# Patient Record
Sex: Female | Born: 1985 | Race: White | Hispanic: No | Marital: Married
Health system: Midwestern US, Community
[De-identification: ages and names within clinical notes are randomized; demographics above are authoritative.]

---

## 2010-12-21 NOTE — Progress Notes (Signed)
Chief complaint:     Chief Complaint   Patient presents with   ??? Endometriosis     found 2007 during a lap, pt would like to discuss options   ??? Pelvic Pain     pelvic pain x 8 months, pain described as shooting in vaginal area       Subjective:   KYLAN VEACH is a 25 y.o. female here for her annual exam. Past medical history, medications, surgeries, allergies and family history were reviewed and updated. Patient's last menstrual period was 12/10/2010.Marland Kitchen Periods are not regular- 25-36 days apart, without  Cramping. Cramping starts intermittently throughout the month and gets worse when period starts. 5-6/10 when not in period-  In period 8-9/10.  Intercourse is painful most of the time. Severe cramping after 7/10. Motrin 400 mg every 6  Hours takes edge off. Has gotten worse last 8 months. Pain is in vaginal area and in urethra area. Flow lasting 7.days. She changes a pad/ tampon every 2 hours. She is not on birth control. NFP.             ROS:  She  complains of abdominal pain, pressure or bloating. Bloating associated with bowels  She complains of constipation, diarrhea, blood in stool, difficulty emptying, encopresis or hemorrhoids.Diarrhea  She denies urinary frequency, urgency, burning, difficulty emptying or incontinence.  She denies vaginal itching, odor, discharge, dryness or dyspareunia. Itching occasionally  She complains of breast lumps, tenderness, recent nipple inversion or galactorrhea. Left breast lump- PCP ordered Mam and USN normal- has recently lost 18 #   She denies of PMS issues.  She denies of menopausal symptoms.  She denies of sexuality concerns.        PHYSICAL EXAM:    BP 117/76   Pulse 74   Ht 5' 6.8" (1.697 m)   Wt 118 lb 3.2 oz (53.615 kg)   BMI 18.62 kg/m2   LMP 12/10/2010   Breastfeeding? No        General Appearance:  DAYA DUTT appears to be non-distressed, well developed, well nourished and well groomed.      HEENT: unremarkable.    Skin:  There was a normal inspection of the  skin without rashes or lesions.    Lymphatic:  No lymph nodes were palpable in the neck , axilla or groin.    Neck:  The thyroid was not enlarged and had no masses. Full range of motion noted.    Respiratory:  The lungs were clear to auscultation bilaterally.     Cardiovascular:  The heart was in a regular rate and rhythm. There was no murmur, rubs or extra sounds appreciated.    Back:  Straight. No CVA tenderness appreciated.    Abdomen:  The abdomen was soft and non-tender. There were bowel sounds present in all quadrants. There was no distention, guarding, rebound, ascites or rigidity.     Psych:  The patient had a normal orientation to: time, place, person, and situation  There is no mood / affect changes    Breast:  (Chest)  The patients breasts were symmetrical. There was a 2 cm nodule in left breast at 1200. Had diagnostic mam and USN in April which was negative. No discharge, skin changes or puckering bilateral. Both nipples were everted. Will get results from PCP    Pelvic Exam:  The external genitalia was without lesions or masses, with a normal appearance.          The vaginal vault  was normal in appearance, pink with rugae. There were no cystocele,  rectocele, or enterocele appreciated. There was no vaginal discharge.  The cervix was without lesions, freely mobile. There was no cervical motion tenderness.  The uterus is normal sized. The adnexa were palpated and noted no fullness, tenderness or masses in either region. There is tenderness and burning in vagina with insertion of speculum  Groin:  No evidence of hernia.    Rectal Exam:  Hemorrhoids present    ASSESSMENT:    Encounter Diagnoses   Name Primary?   ??? Pelvic pain in female    ??? Endometriosis    breast mass-left      PLAN::  Orders Placed This Encounter   Procedures   ??? Ultrasound pelvis complete       Pap was not obtained- done 1-2 months ago. Self breast exam and vulvar exam was discussed and encouraged monthly. Bone health diet and exercise  was discussed.  Encouraged to take in 3 servings of calcium per day and recommend a Multivitamin daily.   1. Return for annual in 3/13   2. Consult with Dr Shari Heritage- sooner than 6/25 if possible.  3. Names of breast surgeons

## 2010-12-31 NOTE — Progress Notes (Signed)
Chief Complaint   Patient presents with   ??? Breast Mass     left, is being sent to [facility] for this, but hasnt heard from her office.  MAMMogram done at breast center of [facility].  calling to get the results.  We are also still waiting to receive records from [facility]   ??? Pelvic Pain     today hurts more on left side, pt does have endometriosis (originally dx'd by [facility] by l/s), and started her period today.  pelvic usn under images     HPI: 25 G2P2 (c/s x2), here to evaluate breast mass and discuss worsening pain from endometriosis. Pain is 6-7/10, intermittent, periods worsen. Dysmenorrhea. Not interested in Lupron. Done with childbearing and the trauma around childbearing, and desires definitive treatment of endometriosis. Knows that I suggest tlh, bso, and not leaving ovaries as a treament of endometriosis. We have multiple options of treatment to work with menopausal symptoms.   Relevant Past Medical History:   Patient Active Problem List   Diagnoses   ??? Pelvic pain in female   ??? Endometriosis   ??? Breast mass, left   ??? Weight loss     Medications:  Current Outpatient Prescriptions   Medication Sig Dispense Refill   ??? ibuprofen (ADVIL;MOTRIN) 200 MG tablet Take 200 mg by mouth as needed.         ??? levothyroxine (SYNTHROID) 50 MCG tablet Take 50 mcg by mouth daily.           Allergies:  Allergies   Allergen Reactions   ??? Latex Rash   ??? Amoxicillin Hives   ??? Augmentin Hives   ??? Codeine      hyperactive   ??? Effexor (Venlafaxine Hydrochloride)      Fast heart beat     Past Medical History   Diagnosis Date   ??? Abdominal pain    ??? Pelvic pain in female    ??? Dysmenorrhea    ??? Viral meningitis      as an infant   ??? IBS (irritable bowel syndrome)    ??? GERD (gastroesophageal reflux disease)    ??? Migraine    ??? Ovarian cyst rupture 08/1998   ??? Diarrhea    ??? ASCUS (atypical squamous cells of undetermined significance) on Pap smear 3/12     HRHPV not detected   ??? Endometriosis 2007     Dx ls w/ Dr.  Andrey Cota     Past Surgical History   Procedure Date   ??? Laparoscopy 2007   ??? Cesarean section 2004, 2009     History   Smoking status   ??? Current Everyday Smoker   Smokeless tobacco   ??? Not on file     History   Alcohol Use   ??? Yes     social     Psychosocial History: None    REVIEW OF SYSTEMS:   General appearance:Appears healthy.  Alert; in no acute distress.  Pleasant.  CARDIOVASCULAR: Denies: chest pain, dyspnea on exertion, palpitations  RESPIRATORY: Denies: cough, shortness of breath, wheezing  GI: Denies: abdominal pain, flank pain, nausea, vomiting, diarrhea  GU: Denies: dysuria, frequency/urgency, hematuria, pelvic pain    vaginal bleeding  MUSCULOSKELETAL: Denies: back pain, joint swelling, muscle pain  SKIN: Denies: rash, hives.    PHYSICAL EXAM:  Blood pressure 97/52, pulse 84, height 5\' 7"  (1.702 m), weight 122 lb 12.8 oz (55.702 kg), last menstrual period 12/31/2010, not currently breastfeeding.  General Appearance:  awake,  alert, oriented, in no acute distress, well developed, well nourished, in no acute distress   Skin:  Skin color, texture, turgor normal. No rashes or lesions  Mouth/Throat:  Mucosa moist.  No lesions.  Pharynx without erythema, edema or exudate.  Lungs:  Normal expansion.  Clear to auscultation.  No rales, rhonchi, or wheezing.  Heart:  Heart sounds are normal.  Regular rate and rhythm without murmur, gallop or rub.  Breast:  Inspection negative. No nipple discharge or bleeding. No palpable mass  Abdomen:  Soft, non-tender, normal bowel sounds.  No bruits, organomegaly or masses.  Extremities: Extremities warm to touch, pink, with no edema.  Musculoskeletal:  negative  Peripheral Pulses:  Normal  Neurologic:  Gait normal. Reflexes normal and symmetric. Sensation grossly intact.  Female Urogen:  External genitalia, appear normal and without lesions; bimanual=-8-9/10 pain with palpation (no bladder pain with palp).  Bimanual exam was limited by pain. Could not fully assess uterus but I  don't believe it is enlarged.  Female Rectal: No hemorrhoids, normal rectal tone, no masses. 8-9/10.    Procedure: AOU Dec 24 2010 8:46AM [id number] PELVIC ECHO COMPLETE   Reason for Exam: ENDOMETRIOSIS / PELVIC PAIN   FULL RESULT: Procedure: Pelvic Ultrasound   Comparison: March 26, 2004.   Technique: Transvaginal and transabdominal.   Signs And Symptoms: Endometriosis, pain.   Findings: The uterus measures 7.1 x 3.6 x 6.0 cm. The endometrial   stripe thickness is 0.7 cm.   The right ovary measures 4.1 x 2.6 x 3.5 cm.   The left ovary measures 3.5 x 1.8 x 2.7 cm.   The uterus is normal. No mass or cyst. No endometrial fluid. Small   amount of fluid in the cul-de-sac.   The ovaries are normal. There is developing follicles. There is no mass   or cyst. There is no adnexal pathology.   IMPRESSION: SMALL AMOUNT OF FREE FLUID IN THE CUL-DE-SAC. OTHERWISE   NORMAL EXAM.     Assessment:  1. Endometriosis    2. Pelvic pain in female    3. Breast mass, left       Plan:  Schedule Robotic tlh, bso, cysto.  F/U on records from [facility] re: dx ls.  Forward records to [employer] re mamm/br usn.  Pamphlet re: robotics.

## 2011-01-04 NOTE — Progress Notes (Signed)
Reports copied and faxed to Dr. Darnelle Bosandilee Butler's office along with progress note re: referral for Breast lump

## 2011-02-04 LAB — CBC WITH DIFFERENTIAL
Absolute Eos #: 0.2 10*3/uL (ref 0.0–0.4)
Absolute Lymph #: 2 10*3/uL (ref 1.0–4.8)
Absolute Mono #: 0.5 10*3/uL (ref 0.1–1.2)
Absolute Neut #: 3.1 10*3/uL (ref 1.8–7.7)
Basophils Absolute: 0 10*3/uL (ref 0.0–0.2)
Basophils: 0 % (ref 0–2)
Eosinophils %: 4 % (ref 1–4)
Hematocrit: 42.2 % (ref 36–46)
Hemoglobin: 14.6 g/dL (ref 12.0–16.0)
Lymphocytes: 34 % (ref 24–44)
MCH: 32.7 pg (ref 26–34)
MCHC: 34.5 g/dL (ref 31–37)
MCV: 94.7 fL (ref 80–100)
MPV: 8.7 fL (ref 6.0–12.0)
Monocytes: 8 % (ref 2–11)
Platelets: 137 10*3/uL — ABNORMAL LOW (ref 140–450)
RBC: 4.45 m/uL (ref 4.0–5.2)
RDW: 13.1 % (ref 12.5–15.4)
Seg Neutrophils: 54 % (ref 36–66)
WBC: 5.8 10*3/uL (ref 3.5–11.0)

## 2011-02-04 LAB — TSH: TSH: 3.28 mIU/L (ref 0.35–5.50)

## 2011-02-04 LAB — HCG, SERUM, QUALITATIVE: hCG Qual: NEGATIVE

## 2011-02-04 LAB — BASIC METABOLIC PANEL
Anion Gap: 4 mmol/L — ABNORMAL LOW (ref 8–16)
BUN: 13 mg/dL (ref 6–20)
CO2: 29 mmol/L (ref 20–31)
Calcium: 9.3 mg/dL (ref 8.6–10.4)
Chloride: 110 mmol/L (ref 98–110)
Creatinine: 0.84 mg/dL (ref 0.4–1.0)
GFR African American: >60 mL/min (ref >60)
GFR Non-African American: >60 mL/min (ref >60)
Glucose: 98 mg/dL (ref 74–106)
Potassium: 3.8 mmol/L (ref 3.5–5.1)
Sodium: 139 mmol/L (ref 136–145)

## 2011-02-04 LAB — T3: T3, Total: 79 ng/dL (ref 60–181)

## 2011-02-04 LAB — T4: T4, Total: 6.6 ug/dL (ref 4.5–10.9)

## 2011-03-05 LAB — CBC
Hematocrit: 36.9 % (ref 36–46)
Hemoglobin: 12.9 g/dL (ref 12.0–16.0)
MCH: 33.1 pg (ref 26–34)
MCHC: 35 g/dL (ref 31–37)
MCV: 94.6 fL (ref 80–100)
MPV: 8.9 fL (ref 6.0–12.0)
Platelets: 140 10*3/uL (ref 140–450)
RBC: 3.9 m/uL — ABNORMAL LOW (ref 4.0–5.2)
RDW: 13.9 % (ref 12.5–15.4)
WBC: 5.2 10*3/uL (ref 3.5–11.0)

## 2011-03-05 LAB — HCG, SERUM, QUALITATIVE: hCG Qual: NEGATIVE

## 2011-03-05 LAB — TYPE AND SCREEN
ABO/Rh: B POS
Antibody Screen: NEGATIVE

## 2011-03-05 LAB — BLOOD BANK SPECIMEN

## 2011-03-05 LAB — PREGNANCY, URINE: HCG(Urine) Pregnancy Test: NEGATIVE

## 2011-03-16 NOTE — Progress Notes (Signed)
SHORT FORM HISTORY AND PHYSICAL  [facility]  Date:____________ Time:____________  Chief Complaint   Patient presents with   . Pre-op Exam     Robotic TLH, possible BSO (based on appearance of ovaries at time of surgery), cysto due to pelvic pain and endometriosis on Friday 03/19/11 @ 7:30 am at [facility].       HPI:25 G2P2 (both c/s dt fetal distress) here desires definitive treatment.   Relevant Past Medical History:   Patient Active Problem List   Diagnoses   . Pelvic pain in female   . Endometriosis   . Breast mass, left   . Weight loss     Medications:  Current Outpatient Prescriptions   Medication Sig Dispense Refill   . ibuprofen (ADVIL;MOTRIN) 200 MG tablet Take 200 mg by mouth as needed.         Marland Kitchen levothyroxine (SYNTHROID) 50 MCG tablet Take 50 mcg by mouth daily.           Allergies:  Allergies   Allergen Reactions   . Latex Rash   . Amoxicillin Hives   . Augmentin Hives   . Codeine      Hyperactive; Able to tolerate Percocet (preferred) or Vicodin.    . Effexor (Venlafaxine Hydrochloride)      Fast heart beat   . Pcn (Penicillins) Hives     Past Medical History   Diagnosis Date   . Abdominal pain    . Pelvic pain in female    . Dysmenorrhea    . Viral meningitis      as an infant   . IBS (irritable bowel syndrome)    . GERD (gastroesophageal reflux disease)    . Migraine    . Ovarian cyst rupture 08/1998   . Diarrhea    . ASCUS (atypical squamous cells of undetermined significance) on Pap smear 3/12     HRHPV not detected   . Endometriosis 2007     Dx ls w/ Dr. Andrey Cota   . MTHFR mutation      Heterozygous.       Past Surgical History   Procedure Date   . Laparoscopy 2007   . Cesarean section 2004, 2009     History   Smoking status   . Current Everyday Smoker   Smokeless tobacco   . Not on file     History   Alcohol Use   . Yes     social     Family History   Problem Relation Age of Onset   . Other Paternal Grandfather      colon cancer, heart attack   . Other Maternal Grandmother       breast ca   . Other Father      hyperlipademia   . Other Mother      scleroderma, fibromyalgia   . Other Paternal Grandfather      multiple PE     Psychosocial History: None    REVIEW OF SYSTEMS:   General appearance:Appears healthy.  Alert; in no acute distress.  Pleasant.  CARDIOVASCULAR: Denies: chest pain, dyspnea on exertion, palpitations  RESPIRATORY: Denies: cough, shortness of breath, wheezing  GI: Denies: abdominal pain, flank pain, nausea, vomiting, diarrhea  GU: Denies: dysuria, frequency/urgency, hematuria, pelvic pain    vaginal bleeding  MUSCULOSKELETAL: Denies: back pain, joint swelling, muscle pain  SKIN: Denies: rash, hives.    PHYSICAL EXAM:  Blood pressure 111/72, pulse 65, temperature 97.3 F (36.3 C), height  5\' 7"  (1.702 m), weight 120 lb 12.8 oz (54.795 kg), last menstrual period 03/09/2011, not currently breastfeeding.  General Appearance:  awake, alert, oriented, in no acute distress, well developed, well nourished, in no acute distress   Skin:  Skin color, texture, turgor normal. No rashes or lesions  Mouth/Throat:  Mucosa moist.  No lesions.  Pharynx without erythema, edema or exudate.  Lungs:  Normal expansion.  Clear to auscultation.  No rales, rhonchi, or wheezing.  Heart:  Heart sounds are normal.  Regular rate and rhythm without murmur, gallop or rub.  Breast:  Inspection negative. No nipple discharge or bleeding. No palpable mass  Abdomen:  Soft, non-tender, normal bowel sounds.  No bruits, organomegaly or masses.  Extremities: Extremities warm to touch, pink, with no edema.  Musculoskeletal:  negative  Peripheral Pulses:  Normal  Neurologic:  Gait normal. Reflexes normal and symmetric. Sensation grossly intact.  Female Urogen:  External genitalia, vagina and cervix appear normal and without lesions.  Bimanual exam: no adnexal masses, uterine enlargement ;POSITIVE tenderness noted-cervix 3/10, left 4-5/10.   Female Rectal: deferred dt difficulty tolerating  Assessment:  1. Pelvic  pain    2. Endometriosis    3. Tobacco abuse      Plan:  Robotic TLH, possible BSO (based on appearance of ovaries at time of surgery), cysto due to pelvic pain and endometriosis on Friday 03/19/11 @ 7:30 am at [facility].    Pt concerned about HRT and wants to avoid ovary removal since family history of breast cancer.

## 2011-03-16 NOTE — Patient Instructions (Signed)
Ok to substitute Fleets PhophoSoda (or generic equiv), drink 4 0z w/ Ice 12 hours apart, two times instead of enema.

## 2011-03-17 NOTE — Telephone Encounter (Signed)
Patient phoned office and spoke with Vanessa DykeKelly S.  States wanted to let you know she wants to "keep her ovaries".  Patient was informed Dr. Shari HeritagePohlod would be notified and patient may need to speak with Dr. Shari HeritagePohlod am of OR.

## 2011-03-19 LAB — HCG, Pregnancy Urine (POC): NEGATIVE

## 2011-03-22 LAB — SURGICAL PATHOLOGY

## 2011-03-22 MED ORDER — NITROFURANTOIN MONOHYD MACRO 100 MG PO CAPS
100 MG | ORAL_CAPSULE | Freq: Two times a day (BID) | ORAL | Status: AC
Start: 2011-03-22 — End: 2011-03-29

## 2011-03-22 NOTE — Progress Notes (Signed)
Chief Complaint   Patient presents with   . Post-op Problem     PO # 3.  Robotic TLH, BSO, cysto, ablation of endometriosis and adhesiolysis, bilateral utereal flow. Was kept overnight on Friday due to not enough urine output without a cath.  Was finally sent home on Saturday night around midnight.  Was taken care of in burn unit after surgery.  Was catheterized multiple times without success.  Pt states the cath was placed into her vagina several times which has now caused a lot of pain and swelling at her urethral opening.     . Abdominal Cramping     from surgery and from gas up into shoulders.  Pain is 6/10 in lower abd, taking one Percocet every 6 hours along with Ibuprofen 600 every 6. Passing gas started last night.   . Swelling     around urethra and vaginal opening.      . Nausea     off/on for the past 24 hours. No vomiting. No fevers.    . Wound Check     some light discharge from a few incision   ncisions-  No drainage, swelling, or redness.  Does have some bleeding from the port site.  Nml ADLs. none  Passing gas, bm, void w/o issues. Still has cath in, no BM yet, passing gas ok (started yesterday).   Vag D/C- small amount of blood  HPI:  Pt is here with mother, and they are both very anxious and distraught. They are reporting their perception about the fact that they didn't get consistent care or communication while the patient was in the hospital POD #1.. Pt herself today is very erratic and inconsitent with her desires. One minute asking for a med and then not wanting the med in another minute. I have spoken with three residents whose impression of events contradict what the patient and mother are saying. Pt is overwhelmed that she is having some surgery complications as she is getting ready to move out of [place] (husband has already moved). Pt has verbalized in past that her mother is not capable of helping out, her father is out of the picture and her mother-in-law is not able to help. Pt refuses  counselling, antidepressants or anxiolytics. Pt is willing to do workup. She is also afraid of having catheter removed but understands that we have to do a voiding trial, as well as imaging studies of renal and pelvic areas to r/o hematoma, ureteral injury, etc. I have offerred to the patient and her mother to speak with the patient representative. Mother was very upset that I did not return (an inordinate amt) of phone calls for the patient to reiterate the same desire for ovarian preservation. I raised the issue that the anxiety and panic attacks I have observed lead me to have the patient consider therapy or meds, but pt does not want to evaluate the anxiety at this time.  Relevant Past Medical History:   Patient Active Problem List   Diagnoses   . Pelvic pain in female   . Endometriosis   . Breast mass, left   . Weight loss     Medications:  Current Outpatient Prescriptions   Medication Sig Dispense Refill   . oxyCODONE-acetaminophen (PERCOCET) 5-325 MG per tablet Take 1 tablet by mouth every 6 hours as needed.         Marland Kitchen ibuprofen (ADVIL;MOTRIN) 200 MG tablet Take 200 mg by mouth as needed.         Marland Kitchen  levothyroxine (SYNTHROID) 50 MCG tablet Take 50 mcg by mouth daily.           Allergies:  Allergies   Allergen Reactions   . Latex Rash   . Amoxicillin Hives   . Augmentin Hives   . Codeine      Hyperactive; Able to tolerate Percocet (preferred) or Vicodin.    . Effexor (Venlafaxine Hydrochloride)      Fast heart beat   . Pcn (Penicillins) Hives     Past Medical History   Diagnosis Date   . Abdominal pain    . Pelvic pain in female    . Dysmenorrhea    . Viral meningitis      as an infant   . IBS (irritable bowel syndrome)    . GERD (gastroesophageal reflux disease)    . Migraine    . Ovarian cyst rupture 08/1998   . Diarrhea    . ASCUS (atypical squamous cells of undetermined significance) on Pap smear 3/12     HRHPV not detected   . Endometriosis 2007     Dx ls w/ Dr. Andrey Cota   . MTHFR mutation      Heterozygous.    . Panic attack    . Anxiety      Has not been seeing counselor due to cost; has not tolerated numerous antidep and anxiolytics- none seemed to work, fears addiction for Xanax     Past Surgical History   Procedure Date   . Laparoscopy 2007   . Cesarean section 2004, 2009   . Hysterectomy 03/19/11     Robotic TLH,, cysto, ablation of endometriosis and adhesiolysis, bilateral utereal flow.      History   Smoking status   . Current Everyday Smoker   Smokeless tobacco   . Not on file     History   Alcohol Use   . Yes     social     Family History   Problem Relation Age of Onset   . Other Paternal Grandfather      colon cancer, heart attack   . Other Maternal Grandmother      breast ca   . Other Father      hyperlipademia   . Other Mother      scleroderma, fibromyalgia   . Other Paternal Grandfather      multiple PE     Psychosocial History: None    REVIEW OF SYSTEMS:   General appearance:Appears healthy.  Alert; in no acute distress.  Pleasant.  CARDIOVASCULAR: Denies: chest pain, dyspnea on exertion, palpitations  RESPIRATORY: Denies: cough, shortness of breath, wheezing  GI: Denies: abdominal pain, flank pain, nausea, vomiting, diarrhea  GU: Denies: dysuria, frequency/urgency, hematuria, pelvic pain    vaginal bleeding  MUSCULOSKELETAL: Denies: back pain, joint swelling, muscle pain  SKIN: Denies: rash, hives.    PHYSICAL EXAM:  Blood pressure 118/76, pulse 76, temperature 98.1 F (36.7 C), height 5\' 7"  (1.702 m), weight 120 lb (54.432 kg), last menstrual period 03/09/2011, not currently breastfeeding.  General Appearance:  awake, alert, oriented, in no acute distress, well developed, well nourished, in no acute distress   Skin:  Skin color, texture, turgor normal. No rashes or lesions  Mouth/Throat:  Mucosa moist.  No lesions.  Pharynx without erythema, edema or exudate.  Lungs:  Normal expansion.  Clear to auscultation.  No rales, rhonchi, or wheezing.  Heart:  Heart sounds are normal.  Regular rate and rhythm  without murmur, gallop or rub.  Abdomen:  Soft, non-tender, normal bowel sounds.  No bruits, organomegaly or masses.  Extremities: Extremities warm to touch, pink, with no edema.  Musculoskeletal:  negative  Peripheral Pulses:  Normal  Neurologic:  Gait normal. Reflexes normal and symmetric. Sensation grossly intact.  Pelvic: Pt is very anxious, could barely spread legs apart. I see no evidence of edema or swelling at the labia. Catheter was removed. Pain was 6/10 at vaginal cuff and possibility of cuff hematoma was considered.  Rectal: No evidence of constipation. No pain. Fullness at cuff was appreciated.  Assessment:  1. Urinary retention    2. Pelvic cramping    3. Possible cuff fluid collection.  4. Suspect anxiety driven urinary retention    PLAN:  Remove cath  Script to reinsert cath if retention. Go to ER if volume is very small despite frequent voiding or if you notice bladder distention.  Reports that pain has decreased to a 4 after removal of foley catheter, but still guarded.  Prophylactic Macrobid for 7 days. Pt worried about taking antibiotic, and stated she probably wouldn't take the antibiotic even if I prescribed it.  Ordered pelvic and renal ultrasound-attempt to get both done today if possible.  RTO on Thursdsay.  TIME SPENT IN ROOM: ONE HOUR

## 2011-03-22 NOTE — Progress Notes (Signed)
Vernice JeffersonHelen Lambros is Patient representative at [facility]. [phone]

## 2011-03-23 NOTE — Telephone Encounter (Signed)
I spoke with Vanessa Rios and told her the results of the pelvic and renal usn she had done yesterday (03-22-11[facility]. She was relieved to heard that the results where normal. Pt is feeling much better today, pain is better, just feels a little pressure, and is urinating at about 200 ml every 2 hours or so. Pt states she is constipated and wanted to know if it would be ok to take milk of magnesia. I checked with Vanessa Rios and she stated that would be fine to use. Pt stated she will see Vanessa Rios at her appt on Thursday, and she was told if she had any questions or concerns between now and then to please give the office a call.

## 2011-03-25 MED ORDER — OXYCODONE-ACETAMINOPHEN 5-325 MG PO TABS
5-325 MG | ORAL_TABLET | ORAL | Status: DC | PRN
Start: 2011-03-25 — End: 2011-04-06

## 2011-03-25 NOTE — Progress Notes (Signed)
Chief Complaint   Patient presents with   . Urinary Retention     Pt states she has been urinating more than 250 cc every 3 hours but still feels like her bladder is full after voiding.     . Vaginal Pain     Pt has pain and also feels like her vagina is swollen.    . Dysuria     Pain before,during,and after urinating in urethra.   . Insomnia     and also hard to sit down due to pain.   . Abdominal Pain     LLQ pain 8/10, described as stabbing.  Also has had a clicking/popping feeling just below xiphoid process and left rib.      HPI: Pt has not been taking Percocet as directed. Still having dysuria. Insomnia is due to pain. Reveiwed that pelvic and renal ultrasounds were normal. Pain was sl better yesterday, but thinks that constipation and swelling/cramping feelings are making her feel worse again today..  Relevant Past Medical History:   Patient Active Problem List   Diagnoses   . Pelvic pain in female   . Endometriosis   . Breast mass, left   . Weight loss     Medications:  Current Outpatient Prescriptions   Medication Sig Dispense Refill   . oxyCODONE-acetaminophen (PERCOCET) 5-325 MG per tablet Take 2 tablets by mouth every 4 hours as needed for Pain for 20 doses.  40 tablet  0   . oxyCODONE-acetaminophen (PERCOCET) 5-325 MG per tablet Take 1 tablet by mouth every 6 hours as needed.         . nitrofurantoin, macrocrystal-monohydrate, (MACROBID) 100 MG capsule Take 1 capsule by mouth 2 times daily for 7 days.  14 capsule  0   . ibuprofen (ADVIL;MOTRIN) 200 MG tablet Take 200 mg by mouth as needed.         Marland Kitchen levothyroxine (SYNTHROID) 50 MCG tablet Take 50 mcg by mouth daily.           Allergies:  Allergies   Allergen Reactions   . Latex Rash   . Amoxicillin Hives   . Augmentin Hives   . Codeine      Hyperactive; Able to tolerate Percocet (preferred) or Vicodin.    . Effexor (Venlafaxine Hydrochloride)      Fast heart beat   . Pcn (Penicillins) Hives     Past Medical History   Diagnosis Date   . Abdominal pain     . Pelvic pain in female    . Dysmenorrhea    . Viral meningitis      as an infant   . IBS (irritable bowel syndrome)    . GERD (gastroesophageal reflux disease)    . Migraine    . Ovarian cyst rupture 08/1998   . Diarrhea    . ASCUS (atypical squamous cells of undetermined significance) on Pap smear 3/12     HRHPV not detected   . Endometriosis 2007     Dx ls w/ Dr. Andrey Cota   . MTHFR mutation      Heterozygous.   . Panic attack    . Anxiety      Has not been seeing counselor due to cost; has not tolerated numerous antidep and anxiolytics- none seemed to work, fears addiction for Xanax     Past Surgical History   Procedure Date   . Laparoscopy 2007   . Cesarean section 2004, 2009   . Hysterectomy 03/19/11     Robotic  TLH, cysto, ablation of endometriosis and adhesiolysis, bilateral ureteral flow.      History   Smoking status   . Current Everyday Smoker   Smokeless tobacco   . Not on file     History   Alcohol Use   . Yes     social     Family History   Problem Relation Age of Onset   . Other Paternal Grandfather      colon cancer, heart attack   . Other Maternal Grandmother      breast ca   . Other Father      hyperlipademia   . Other Mother      scleroderma, fibromyalgia   . Other Paternal Grandfather      multiple PE     Psychosocial History: None    REVIEW OF SYSTEMS:   General appearance:Appears healthy.  Alert; in no acute distress.  Pleasant.  CARDIOVASCULAR: Denies: chest pain, dyspnea on exertion, palpitations  RESPIRATORY: Denies: cough, shortness of breath, wheezing  GI: Denies: abdominal pain, flank pain, nausea, vomiting, diarrhea  GU: Denies: dysuria, frequency/urgency, hematuria, pelvic pain    vaginal bleeding  MUSCULOSKELETAL: Denies: back pain, joint swelling, muscle pain  SKIN: Denies: rash, hives.    PHYSICAL EXAM:  Blood pressure 121/79, pulse 81, temperature 97 F (36.1 C), weight 127 lb 9.6 oz (57.879 kg), last menstrual period 03/09/2011, not currently breastfeeding.  General Appearance:   awake, alert, oriented, in no acute distress, well developed, well nourished, in no acute distress   Skin:  Skin color, texture, turgor normal. No rashes or lesions  Mouth/Throat:  Mucosa moist.  No lesions.  Pharynx without erythema, edema or exudate.  Lungs:  Normal expansion.  Clear to auscultation.  No rales, rhonchi, or wheezing.  Heart:  Heart sounds are normal.  Regular rate and rhythm without murmur, gallop or rub.  Abdomen:  Soft, non-tender, normal bowel sounds.  No bruits, organomegaly or masses.  Extremities: Extremities warm to touch, pink, with no edema.  Musculoskeletal:  negative  Peripheral Pulses:  Normal  Neurologic:  Gait normal. Reflexes normal and symmetric. Sensation grossly intact.  Female Urogen:  External genitalia,appear normal and without lesions-no evidence of edema, erythema, rash or appearance of allergic response.  Bimanual exam: deferred.  Assessment:  1. Dysuria    2. Pelvic pain    3. Insomnia       Plan:  Script for Percocet given.  Advised to take Percocet as directed for pain.  MOM for constipation -does not want stool softener. Says that OTC enemas don't seem to work.  Try using icepack on perineum.  Continue antibiotics.  Keep first postop visit for next week.  >50% spent in care counselling and coordinating of the 25 min.

## 2011-03-30 NOTE — Progress Notes (Signed)
POD # 11    From.  Robotic TLH, cysto, ablation of endometriosis and adhesiolysis, bilateral ureteral flow on 03/19/11.   Doing well.  better  Incisions- no s/s of infection at any of incision sites.   Nml ADLs.  No bending, no lifting, other activities normal now. Low energy still.    Passing gas, bm, void w/o issues. Dysuria noted, regular BM and passing gas ok.   Vag D/C- none  Path reviewed-all benign.  BP 117/72  Pulse 79  Temp 97 F (36.1 C)  Ht 5\' 7"  (1.702 m)  Wt 121 lb 6.4 oz (55.067 kg)  BMI 19.01 kg/m2  LMP 03/09/2011  Breastfeeding? No  PE-unremarkable.   Well healed sutures. Three knots removed from port sites, bandages applied.  Abd soft, nt, nd, +bs  No drng from vagina. No evidence of granulation tissue  Follow-up: Annual in March 2013 or sooner if issues

## 2011-04-06 NOTE — Progress Notes (Signed)
Chief Complaint   Patient presents with   . Wound Check     PO # 19. Robotic TLH, cysto, ablation of endometriosis and adhesiolysis, bilateral ureteral flow.  incision check today.  some of her stitches are sticking out at incision at bellybutton.   . Vaginal Pain     about 3 am she woke up with a pain in vaginal walls.     Vaginal spasm was not related to urination-spontaneously awakened her and used Motrin seemed to help.  Urination a lot better.  Curious about intercourse.  Pain in pelvic area from endometriosis seems to be better, as well as diarrhea.  No FCNVD.  Doing well.  yes  Incisions- needs sutures trimmed at bellybutton  Nml ADLs.  yes  Passing gas, bm, void w/o issues.  none  Vag D/C- none  Past Medical History   Diagnosis Date   . Abdominal pain    . Pelvic pain in female    . Dysmenorrhea    . Viral meningitis      as an infant   . IBS (irritable bowel syndrome)    . GERD (gastroesophageal reflux disease)    . Migraine    . Ovarian cyst rupture 08/1998   . Diarrhea    . ASCUS (atypical squamous cells of undetermined significance) on Pap smear 3/12     HRHPV not detected   . Endometriosis 2007     Dx ls w/ Dr. Andrey Cota   . MTHFR mutation      Heterozygous.   . Panic attack    . Anxiety      Has not been seeing counselor due to cost; has not tolerated numerous antidep and anxiolytics- none seemed to work, fears addiction for Xanax     Past Surgical History   Procedure Date   . Laparoscopy 2007   . Cesarean section 2004, 2009   . Hysterectomy 03/19/11     Robotic TLH, cysto, ablation of endometriosis and adhesiolysis, bilateral ureteral flow.      Current Outpatient Prescriptions on File Prior to Visit   Medication Sig Dispense Refill   . ibuprofen (ADVIL;MOTRIN) 200 MG tablet Take 200 mg by mouth as needed.         Marland Kitchen levothyroxine (SYNTHROID) 50 MCG tablet Take 50 mcg by mouth daily.           Allergies   Allergen Reactions   . Latex Rash   . Amoxicillin Hives   . Augmentin Hives   . Codeine       Hyperactive; Able to tolerate Percocet (preferred) or Vicodin.    . Effexor (Venlafaxine Hydrochloride)      Fast heart beat   . Pcn (Penicillins) Hives     BP 113/77  Pulse 74  Ht 5\' 7"  (1.702 m)  Wt 124 lb (56.246 kg)  BMI 19.42 kg/m2  LMP 03/09/2011  Breastfeeding? No  PE-unremarkable.   Well healed sutures-knot removed from umbilicus area  No drng from vagina.  Pelvic deferred  1. Vaginal pain    2. Visit for suture removal      F/U annual   OK to be stimulated but no penetrating intercourse until 4-6 wks.

## 2016-01-10 ENCOUNTER — Emergency Department: Admit: 2016-01-10 | Payer: BLUE CROSS/BLUE SHIELD

## 2016-01-10 ENCOUNTER — Inpatient Hospital Stay
Admit: 2016-01-10 | Discharge: 2016-01-10 | Disposition: A | Discharge status: Home / Self Care | Payer: BLUE CROSS/BLUE SHIELD | Attending: Emergency Medicine

## 2016-01-10 DIAGNOSIS — R1013 Epigastric pain: Secondary | ICD-10-CM

## 2016-01-10 LAB — CBC WITH AUTO DIFFERENTIAL
Absolute Eos #: 0.1 10*3/uL (ref 0.0–0.4)
Absolute Lymph #: 2.1 10*3/uL (ref 1.0–4.8)
Absolute Mono #: 0.4 10*3/uL (ref 0.1–1.2)
Basophils Absolute: 0 10*3/uL (ref 0.0–0.2)
Basophils: 0 %
Eosinophils %: 2 %
Hematocrit: 42.8 % (ref 36–46)
Hemoglobin: 14.5 g/dL (ref 12.0–16.0)
Lymphocytes: 38 %
MCH: 31.3 pg (ref 26–34)
MCHC: 33.9 g/dL (ref 31–37)
MCV: 92.4 fL (ref 80–100)
MPV: 9.2 fL (ref 6.0–12.0)
Monocytes: 7 %
Platelets: 163 10*3/uL (ref 140–450)
RBC: 4.63 m/uL (ref 4.0–5.2)
RDW: 12.3 % — ABNORMAL LOW (ref 12.5–15.4)
Seg Neutrophils: 53 %
Segs Absolute: 2.9 10*3/uL (ref 1.8–7.7)
WBC: 5.5 10*3/uL (ref 3.5–11.0)

## 2016-01-10 LAB — BRAIN NATRIURETIC PEPTIDE: Pro-BNP: 42 pg/mL (ref <300)

## 2016-01-10 LAB — COMPREHENSIVE METABOLIC PANEL
ALT: 19 U/L (ref 5–33)
AST: 21 U/L (ref <32)
Albumin/Globulin Ratio: 1.5 (ref 1.0–2.5)
Albumin: 4.1 g/dL (ref 3.5–5.2)
Alkaline Phosphatase: 52 U/L (ref 35–104)
Anion Gap: 14 mmol/L (ref 9–17)
BUN: 13 mg/dL (ref 6–20)
CO2: 21 mmol/L (ref 20–31)
Calcium: 9.1 mg/dL (ref 8.6–10.4)
Chloride: 104 mmol/L (ref 98–107)
Creatinine: 0.61 mg/dL (ref 0.50–0.90)
GFR African American: >60 mL/min (ref >60)
GFR Non-African American: >60 mL/min (ref >60)
Glucose: 98 mg/dL (ref 70–99)
Potassium: 3.9 mmol/L (ref 3.7–5.3)
Sodium: 139 mmol/L (ref 135–144)
Total Bilirubin: 0.8 mg/dL (ref 0.3–1.2)
Total Protein: 6.9 g/dL (ref 6.4–8.3)

## 2016-01-10 LAB — UA W/REFLEX CULTURE
Bilirubin Urine: NEGATIVE
Glucose, Ur: NEGATIVE
Leukocyte Esterase, Urine: NEGATIVE
Nitrite, Urine: NEGATIVE
Protein, UA: NEGATIVE
Specific Gravity, UA: 1.025 (ref 1.005–1.030)
Urine Hgb: NEGATIVE
Urobilinogen, Urine: NORMAL
pH, UA: 7 (ref 5.0–8.0)

## 2016-01-10 LAB — TROPONIN: Troponin T: <0.03 ng/mL (ref <0.03)

## 2016-01-10 LAB — CK-MB: CK-MB: <1 ng/mL (ref <5.4)

## 2016-01-10 LAB — MYOGLOBIN, SERUM: Myoglobin: <21 ng/mL — ABNORMAL LOW (ref 25–58)

## 2016-01-10 LAB — CK: Total CK: 54 U/L (ref 26–192)

## 2016-01-10 LAB — PREGNANCY, URINE: HCG(Urine) Pregnancy Test: NEGATIVE

## 2016-01-10 LAB — LIPASE: Lipase: 39 U/L (ref 13–60)

## 2016-01-10 MED ORDER — ALUM & MAG HYDROXIDE-SIMETH 200-200-20 MG/5ML PO SUSP
200-200-20 MG/5ML | Freq: Once | ORAL | Status: AC
Start: 2016-01-10 — End: 2016-01-10
  Administered 2016-01-10: 16:00:00 30 mL via ORAL

## 2016-01-10 MED ORDER — SIMETHICONE 80 MG PO TABS
80 MG | ORAL_TABLET | Freq: Three times a day (TID) | ORAL | 0 refills | Status: AC
Start: 2016-01-10 — End: 2016-01-15

## 2016-01-10 MED ORDER — KETOROLAC TROMETHAMINE 30 MG/ML IJ SOLN
30 MG/ML | Freq: Once | INTRAMUSCULAR | Status: AC
Start: 2016-01-10 — End: 2016-01-10
  Administered 2016-01-10: 16:00:00 30 mg via INTRAVENOUS

## 2016-01-10 MED FILL — KETOROLAC TROMETHAMINE 30 MG/ML IJ SOLN: 30 mg/mL | INTRAMUSCULAR | Qty: 1

## 2016-01-10 MED FILL — MAG-AL PLUS 200-200-20 MG/5ML PO LIQD: 200-200-20 MG/5ML | ORAL | Qty: 30

## 2016-01-10 NOTE — ED Provider Notes (Signed)
eMERGENCY dEPARTMENT eNCOUnter      Pt Name: Vanessa Rios  MRN: [id number]  Birthdate 03/14/1986  Date of evaluation: 01/10/2016      CHIEF COMPLAINT       Chief Complaint   Patient presents with   ??? Chest Pain   ??? Abdominal Pain          HISTORY OF PRESENT ILLNESS    Vanessa Rios is a 30 y.o. female who presents to the emergency department with acute onset of epigastric abdominal and lower chest pain she says started earlier today.  Patient states that this is similar to what she had when she had her gallbladder attack and has had a history of pancreatitis also the past.  She's had her gallbladder taken out and she says that she's been doing fairly well since then.  She denies nausea, vomiting, diaphoresis, shortness of breath.  She describes the sensation as pressure like in in her epigastrium.  She rates her discomfort at about a 4 out of 10 with no palliative or provocative.    REVIEW OF SYSTEMS         Review of Systems   Constitutional: Negative.    HENT: Negative.    Eyes: Negative.    Respiratory: Negative.    Cardiovascular: Positive for chest pain.   Gastrointestinal: Negative.    Genitourinary: Negative.    Musculoskeletal: Negative.    Skin: Negative.    Neurological: Negative.    Psychiatric/Behavioral: Negative.      PAST MEDICAL HISTORY    has a past medical history of Abdominal pain; Anxiety; ASCUS (atypical squamous cells of undetermined significance) on Pap smear; Diarrhea; Dysmenorrhea; Endometriosis; GERD (gastroesophageal reflux disease); IBS (irritable bowel syndrome); Migraine; MTHFR mutation (HCC); Ovarian cyst rupture; Panic attack; Pelvic pain in female; and Viral meningitis.    SURGICAL HISTORY      has a past surgical history that includes laparoscopy (2007); Cesarean section (2004, 2009); Hysterectomy (03/19/11); Cholecystectomy; and Wisdom tooth extraction.    CURRENT MEDICATIONS       Previous Medications    IBUPROFEN (ADVIL;MOTRIN) 200 MG TABLET    Take 200 mg by mouth as needed.       LEVOTHYROXINE (SYNTHROID) 50 MCG TABLET    Take 100 mcg by mouth daily 100mcg Mon-Sat 80mcg Sunday       ALLERGIES     is allergic to latex; amoxicillin; amoxicillin-pot clavulanate; codeine; effexor [venlafaxine hydrochloride]; and pcn [penicillins].    FAMILY HISTORY     indicated that the status of her mother is unknown. She indicated that the status of her father is unknown. She indicated that the status of her maternal grandmother is unknown. She indicated that the status of her paternal grandfather is unknown.    family history includes Other in her father, maternal grandmother, mother, and paternal grandfather.    SOCIAL HISTORY      reports that she has quit smoking. She does not have any smokeless tobacco history on file. She reports that she drinks alcohol. She reports that she does not use illicit drugs.    PHYSICAL EXAM     INITIAL VITALS:  height is 5\' 7"  (1.702 m) and weight is 60.3 kg (133 lb). Her oral temperature is 97.9 ??F (36.6 ??C). Her blood pressure is 106/62 and her pulse is 64. Her respiration is 14 and oxygen saturation is 98%.      Physical Exam   Constitutional: She is oriented to person, place, and time. She  appears well-developed and well-nourished. No distress.   HENT:   Head: Normocephalic and atraumatic.   Eyes: EOM are normal. Pupils are equal, round, and reactive to light.   Neck: Normal range of motion. Neck supple.   Cardiovascular: Normal rate and regular rhythm.    Pulmonary/Chest: Effort normal and breath sounds normal.   Abdominal: Soft. Bowel sounds are normal.   Musculoskeletal: Normal range of motion.   Neurological: She is alert and oriented to person, place, and time.   Skin: Skin is warm and dry. She is not diaphoretic.   Psychiatric: She has a normal mood and affect.   Vitals reviewed.      DIFFERENTIAL DIAGNOSIS/ MDM:     Cardiac etiology versus abdominal etiology, patient will get a cardiac workup and she will get some medication and be reassessed.    DIAGNOSTIC  RESULTS     EKG: All EKG's are interpreted by the Emergency Department Physician who either signs or Co-signs this chart in the absence of a cardiologist.    EKG shows a sinus rhythm at 62 bpm.  PR interval is 0.142, Q restoration is 0.092, QTC is 373 ms.  No ST-T wave changes of any ischemia noted.    Not indicated unless otherwise documented above    LABS:  Results for orders placed or performed during the hospital encounter of 01/10/16   CK   Result Value Ref Range    Total CK 54 26 - 192 U/L   CK MB   Result Value Ref Range    CK-MB <1.0 <5.4 ng/mL   Myoglobin, Serum   Result Value Ref Range    Myoglobin <21 (L) 25 - 58 ng/mL   CBC Auto Differential   Result Value Ref Range    WBC 5.5 3.5 - 11.0 k/uL    RBC 4.63 4.0 - 5.2 m/uL    Hemoglobin 14.5 12.0 - 16.0 g/dL    Hematocrit 21.3 36 - 46 %    MCV 92.4 80 - 100 fL    MCH 31.3 26 - 34 pg    MCHC 33.9 31 - 37 g/dL    RDW 08.6 (L) 57.8 - 15.4 %    Platelets 163 140 - 450 k/uL    MPV 9.2 6.0 - 12.0 fL    Differential Type NOT REPORTED     WBC Morphology NOT REPORTED     RBC Morphology NOT REPORTED     Platelet Estimate NOT REPORTED     Seg Neutrophils 53 %    Lymphocytes 38 %    Monocytes 7 %    Eosinophils % 2 %    Basophils 0 %    Segs Absolute 2.90 1.8 - 7.7 k/uL    Absolute Lymph # 2.10 1.0 - 4.8 k/uL    Absolute Mono # 0.40 0.1 - 1.2 k/uL    Absolute Eos # 0.10 0.0 - 0.4 k/uL    Basophils # 0.00 0.0 - 0.2 k/uL   Brain Natriuretic Peptide   Result Value Ref Range    Pro-BNP 42 <300 pg/mL    BNP Interpretation         Lipase   Result Value Ref Range    Lipase 39 13 - 60 U/L   Troponin   Result Value Ref Range    Troponin T <0.03 <0.03 ng/mL    Troponin Interp         Comprehensive Metabolic Panel   Result Value Ref Range    Glucose 98  70 - 99 mg/dL    BUN 13 6 - 20 mg/dL    CREATININE 1.61 0.96 - 0.90 mg/dL    Bun/Cre Ratio NOT REPORTED 9 - 20    Calcium 9.1 8.6 - 10.4 mg/dL    Sodium 045 409 - 811 mmol/L    Potassium 3.9 3.7 - 5.3 mmol/L    Chloride 104 98 - 107  mmol/L    CO2 21 20 - 31 mmol/L    Anion Gap 14 9 - 17 mmol/L    Alkaline Phosphatase 52 35 - 104 U/L    ALT 19 5 - 33 U/L    AST 21 <32 U/L    Total Bilirubin 0.80 0.3 - 1.2 mg/dL    Total Protein 6.9 6.4 - 8.3 g/dL    Alb 4.1 3.5 - 5.2 g/dL    Albumin/Globulin Ratio 1.5 1.0 - 2.5    GFR Non-African American >60 >60 mL/min    GFR African American >60 >60 mL/min    GFR Comment          GFR Staging NOT REPORTED    UA W/REFLEX CULTURE   Result Value Ref Range    Color, UA YELLOW YEL    Turbidity UA CLEAR CLEAR    Glucose, Ur NEGATIVE NEG    Bilirubin Urine NEGATIVE NEG    Ketones, Urine TRACE (A) NEG    Specific Gravity, UA 1.025 1.005 - 1.030    Urine Hgb NEGATIVE NEG    pH, UA 7.0 5.0 - 8.0    Protein, UA NEGATIVE NEG    Urobilinogen, Urine Normal NORM    Nitrite, Urine NEGATIVE NEG    Leukocyte Esterase, Urine NEGATIVE NEG    Urinalysis Comments       Microscopic exam not performed based on chemical results unless requested in   Pregnancy, Urine   Result Value Ref Range    HCG(Urine) Pregnancy Test NEGATIVE NEG       Not indicated unless otherwise documented above    RADIOLOGY:   I reviewed the radiologist interpretations:  XR Chest Portable   Final Result   Negative chest radiograph             Not indicated unless otherwise documented above    EMERGENCY DEPARTMENT COURSE:     The patient was given the following medications:  Orders Placed This Encounter   Medications   ??? ketorolac (TORADOL) injection 30 mg   ??? aluminum & magnesium hydroxide-simethicone (MAALOX) 200-200-20 MG/5ML suspension 30 mL   ??? Simethicone 80 MG TABS     Sig: Take 1 tablet by mouth 3 times daily for 5 days     Dispense:  15 tablet     Refill:  0        Vitals:    Vitals:    01/10/16 1146 01/10/16 1154 01/10/16 1220   BP: 120/77 123/66 106/62   Pulse: 73 88 64   Resp: 18 22 14    Temp: 97.9 ??F (36.6 ??C)     TempSrc: Oral     SpO2: 100% 100% 98%   Weight: 60.3 kg (133 lb)     Height: 5\' 7"  (1.702 m)       -------------------------  BP 106/62    Pulse 64   Temp 97.9 ??F (36.6 ??C) (Oral)    Resp 14   Ht 5\' 7"  (1.702 m)   Wt 60.3 kg (133 lb)   LMP 03/09/2011   SpO2 98%   BMI 20.83 kg/m2  I have reviewed the disposition diagnosis with the patient and or their family/guardian. I have answered their questions and given discharge instructions. They voiced understanding of these instructions and did not have any further questions or complaints.    CRITICAL CARE:    None    CONSULTS:    None    PROCEDURES:    None      OARRS Report if indicated             FINAL IMPRESSION      1. Abdominal pain, epigastric          DISPOSITION/PLAN   DISPOSITION Decision To Discharge    I have reviewed the disposition diagnosis with the patient and or their family/guardian.  I have answered their questions and given discharge instructions.  They voiced understanding of these instructions and did not have any further questions or complaints.    PATIENT REFERRED TO:  Sande Rives, NP  [address]  [address]  [place] [address]  [phone]    In 2 days        DISCHARGE MEDICATIONS:  New Prescriptions    SIMETHICONE 80 MG TABS    Take 1 tablet by mouth 3 times daily for 5 days       (Please note that portions of this note were completed with a voice recognition program.  Efforts were made to edit the dictations but occasionally words are mis-transcribed.)    Donna Bernard,, MD  Attending Emergency Physician               Donna Bernard, MD  01/10/16 1255

## 2016-01-10 NOTE — ED Notes (Signed)
Patient cleared for discharge per MD. Patient discharge instructions explained, Rx given and explained to patient. Patient Verbalized understanding of all instructions and all patient questions answered to their satisfaction. Patient departs from ED in stable condition.        Deanne CofferJennifer N Dare Sanger, RN  01/10/16 1306

## 2016-01-13 LAB — EKG 12-LEAD
Atrial Rate: 62 {beats}/min
P Axis: 75 degrees
P-R Interval: 142 ms
Q-T Interval: 368 ms
QRS Duration: 92 ms
QTc Calculation (Bazett): 373 ms
R Axis: 56 degrees
T Axis: 49 degrees
Ventricular Rate: 62 {beats}/min

## 2020-05-21 IMAGING — CT CT ABDOMEN AND PELVIS WITH CONTRAST
2 of 3 series · 16 of 46 positions shown, 18 images · IV contrast (APPLIED)
Comparison: Comparison was made to the prior exam(s) within the last 12 months 
dated

CT ABDOMEN AND PELVIS WITH CONTRAST, 05/21/2020 [DATE]: 
CLINICAL INDICATION:  Abdominal pain with abdominal pressure worse after meals. 
Patient feels full. 
A search for DICOM formatted images was conducted for prior CT imaging studies 
completed at a non-affiliated media free facility.
TECHNIQUE: The abdomen and pelvis were scanned from lung bases through the 
pubic rami with 100 cc's of Isovue 300 injected intravenously on a 
high-resolution Ct scanner using dose reduction techniques.  Routine MPR 
reconstructions were performed.

[Series 4: axial · axial · 0.70mm/px · z∈[-541,-130]mm · 13 of 159 slices shown, 15 images]
[im 11/159  soft-tissue]
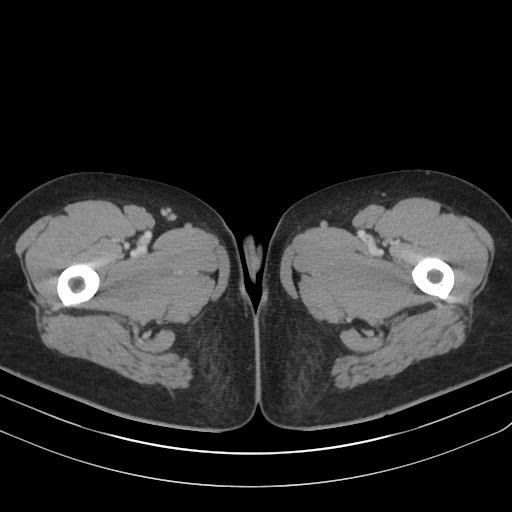
[im 11/159  bone]
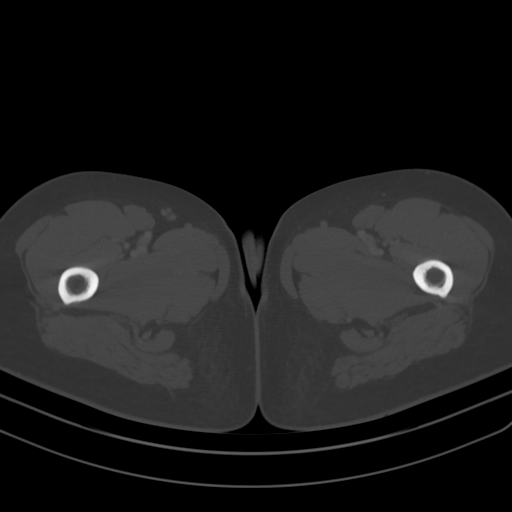
[im 21/159  soft-tissue]
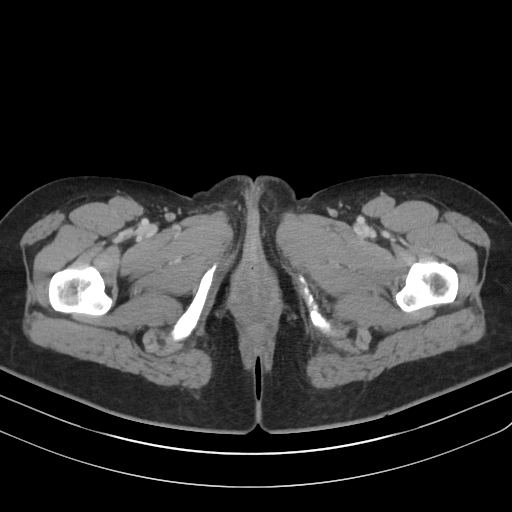
[im 31/159  soft-tissue]
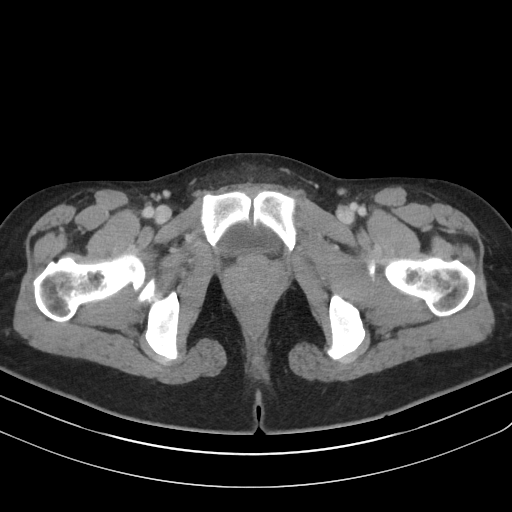
[im 46/159  soft-tissue]
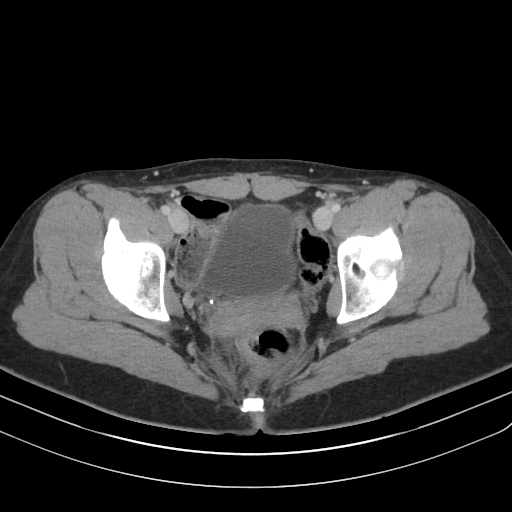
[im 57/159  soft-tissue]
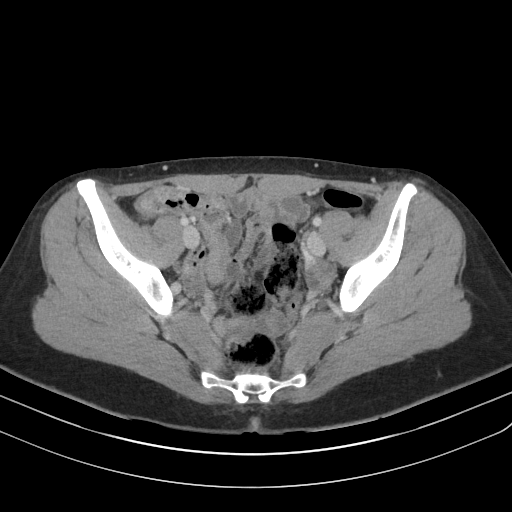
[im 67/159  soft-tissue]
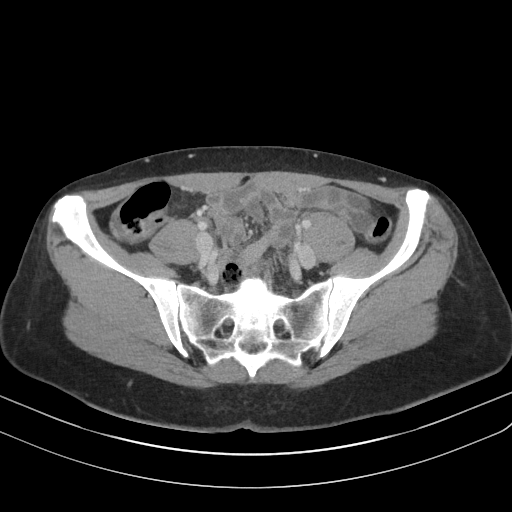
[im 82/159  soft-tissue]
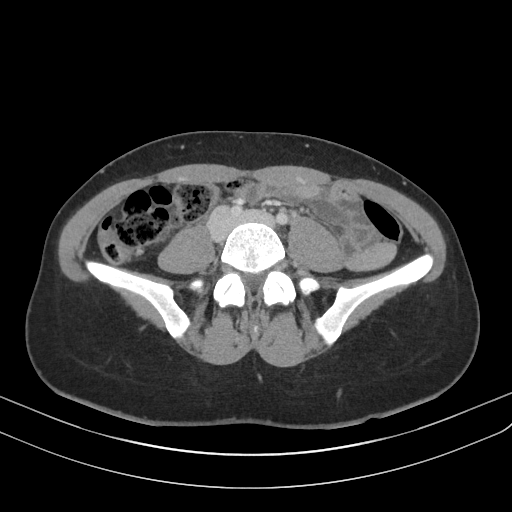
[im 92/159  soft-tissue]
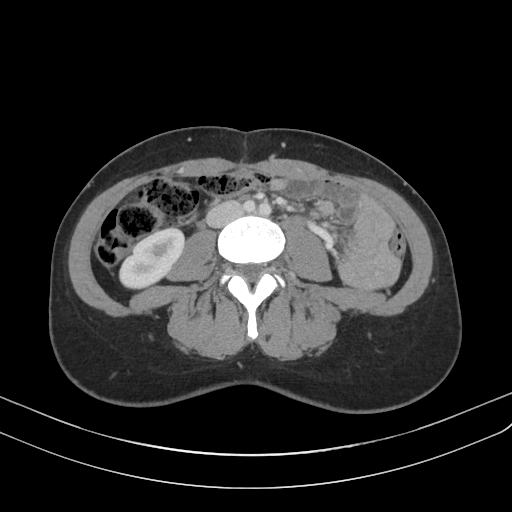
[im 102/159  soft-tissue]
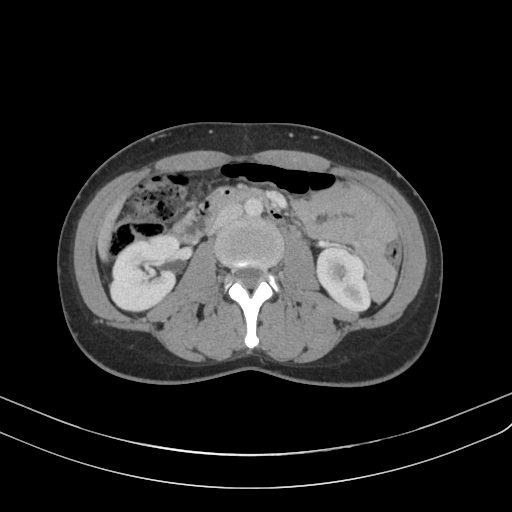
[im 102/159  bone]
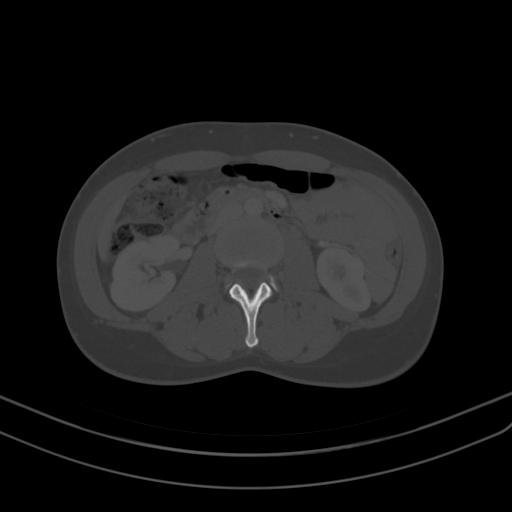
[im 113/159  soft-tissue]
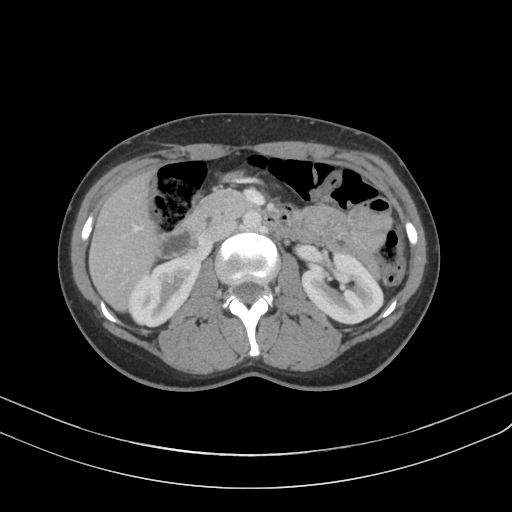
[im 128/159  soft-tissue]
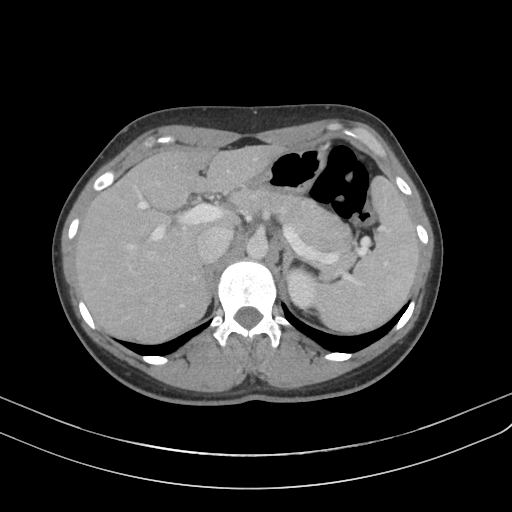
[im 138/159  soft-tissue]
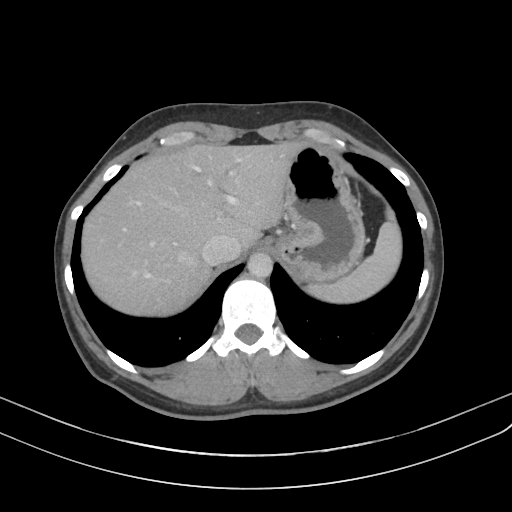
[im 148/159  soft-tissue]
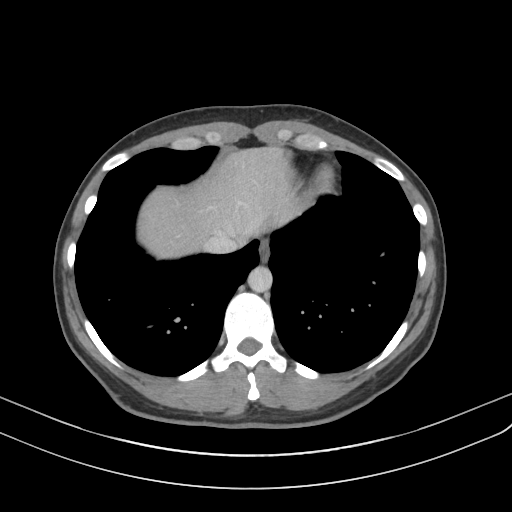

[Series 5: cor · coronal · 0.86mm/px · 3 of 112 slices shown]
[im 38/112  soft-tissue]
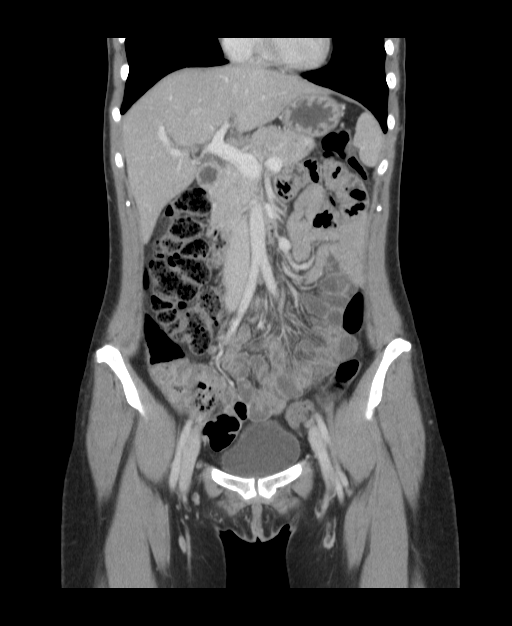
[im 50/112  soft-tissue]
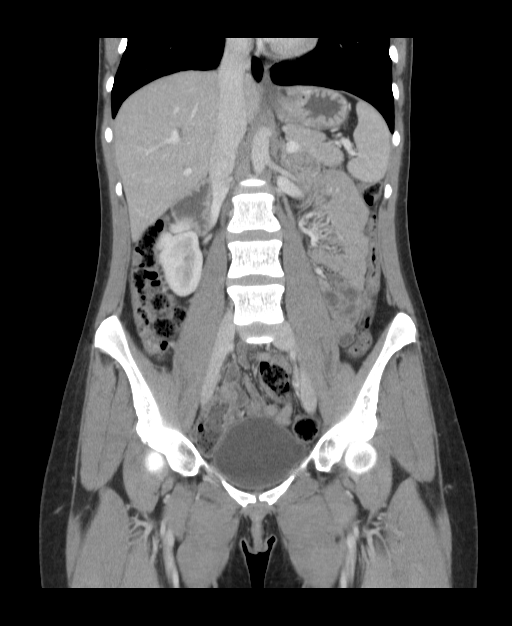
[im 62/112  soft-tissue]
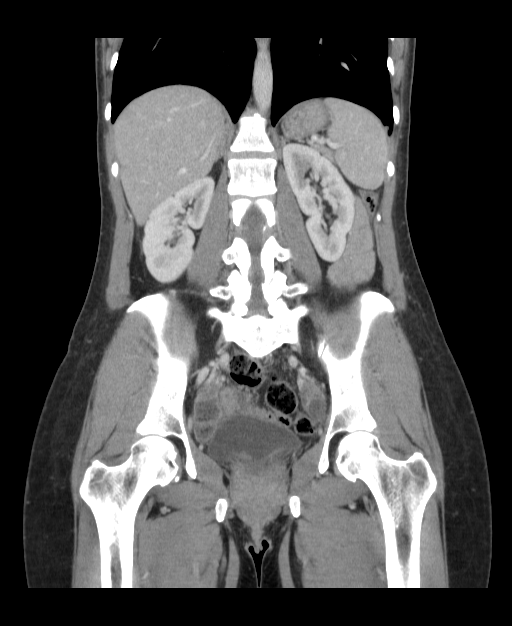

[16 of 46 positions shown; findings below may reference images not displayed]

FINDINGS: Lung bases are clear. The liver, spleen, pancreas, adrenals and 
kidneys are normal. Gallbladder is been removed. The stomach is partially 
distended with some prominent rugal folds, but otherwise with no mass or 
inflammatory changes. Large and small bowel loops within the abdomen appear 
normal without inflammatory change or obstruction. 
On CT of the pelvis, there is trace amount free fluid in pelvis. The appendix is 
not well-visualized but no inflammatory changes in right lower quadrant. Bowel 
loops in pelvis are normal. No pelvic mass or pelvic adenopathy. No inguinal 
adenopathy. No lytic or blastic lesions are seen in the lumbar spine or pelvis.
IMPRESSION: Trace amount of free fluid in the pelvis, otherwise no inflammatory changes, 
obstruction or mass seen on CT of the abdomen pelvis. 
Prominent rugal folds in the stomach which otherwise appears normal without mass 
or inflammatory changes. 
RADIATION DOSE REDUCTION: All CT scans are performed using radiation dose 
reduction techniques, when applicable.  Technical factors are evaluated and 
adjusted to ensure appropriate moderation of exposure.  Automated dose 
management technology is applied to adjust the radiation doses to minimize 
exposure while achieving diagnostic quality images.

## 2020-09-19 IMAGING — MR MRI BRAIN WITH  IAC W/WO CONTRAST
6 of 17 series · 21 of 48 positions shown · IV contrast (gadavist)
Comparison: None

MRI BRAIN WITH  IAC W/WO CONTRAST, 09/19/2020 [DATE]: 
CLINICAL INDICATION:  Vertigo, tinnitus, progressive on the left., Left-sided 
hearing loss
TECHNIQUE: Sagittal T1, axial T1, axial FLAIR, diffusion images, axial T2 and 
thin section axial FFE sequences obtained. After the intravenous administration 
of 6.5 cc gadavist were administered intravenously, thin section axial T1 images 
through the IACs and fat-suppressed axial T1, coronal T1 images through the 
entire brain were obtained. As per [HOSPITAL] guidelines 3-D 
reconstructions are performed with concurrent physician supervision.

[Series 401: FLAIR · axial · 5.0mm · 0.65mm/px · z∈[-78,+73]mm · 2 of 27 slices shown]
[im 1/27]
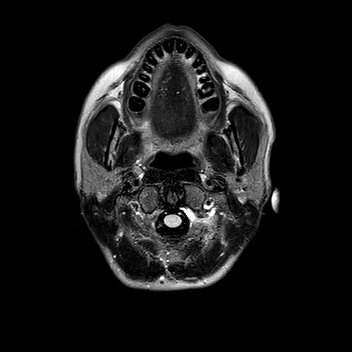
[im 27/27]
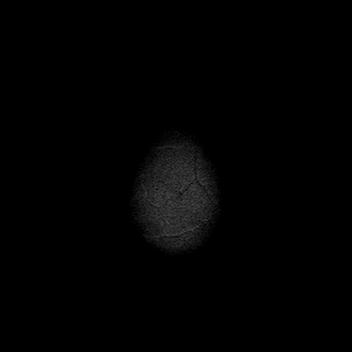

[Series 601: SWI · axial · 3.0mm · 0.53mm/px · z∈[-77,+72]mm · 8 of 103 slices shown (1 of 2)]
[im 1/103]
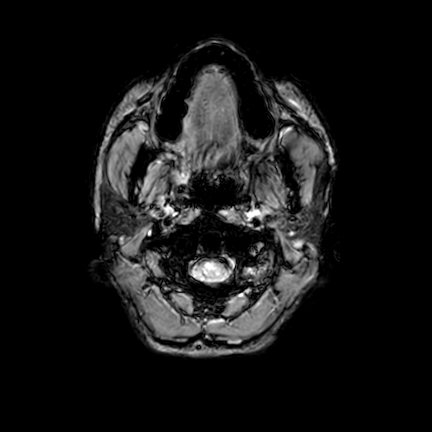
[im 15/103]
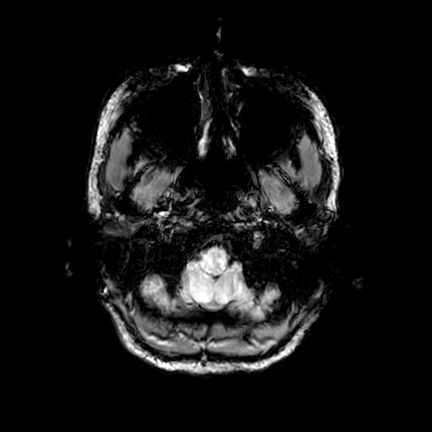
[im 30/103]
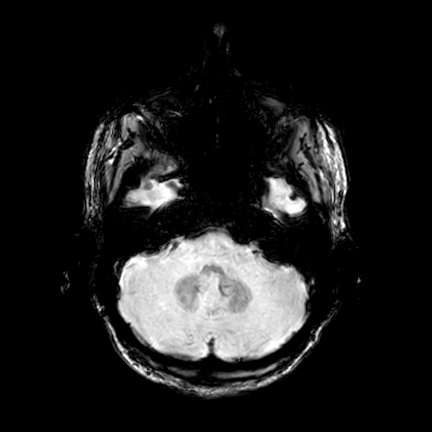
[im 44/103]
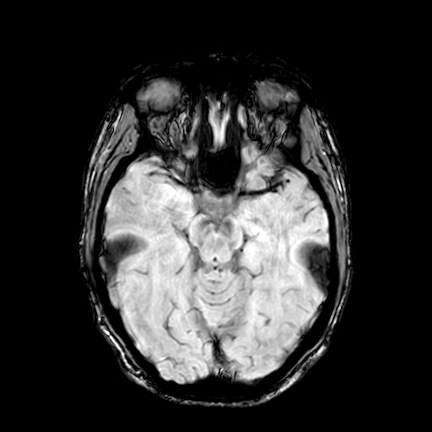
[im 59/103]
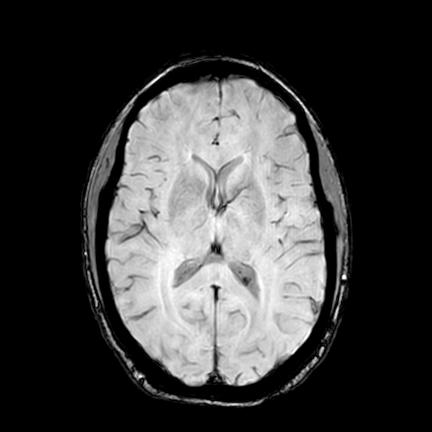
[im 73/103]
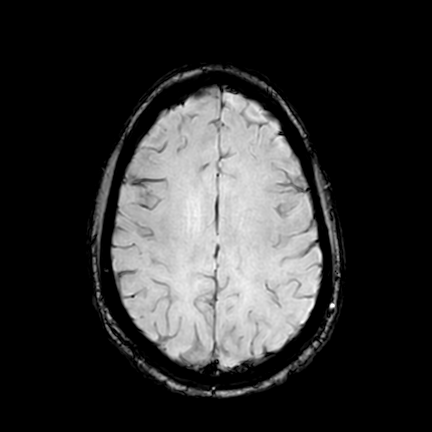
[im 88/103]
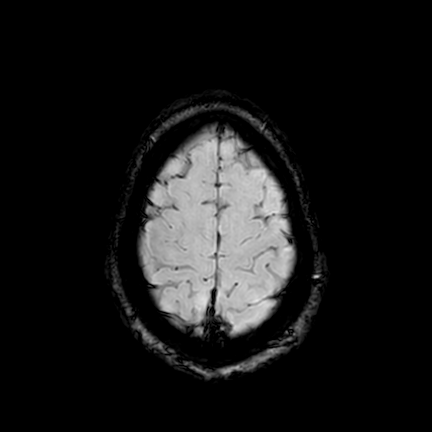
[im 103/103]
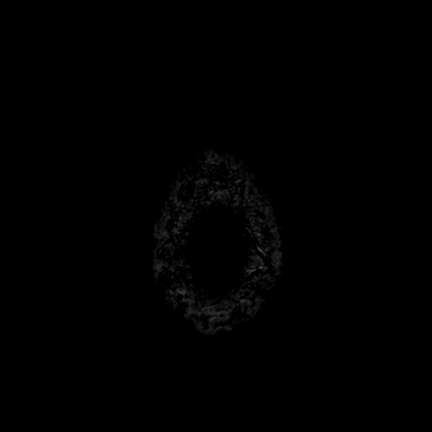

[Series 602: SWI · axial · 10.0mm · 0.53mm/px · z∈[-75,+14]mm · 4 of 79 slices shown (2 of 2)]
[im 1/79]
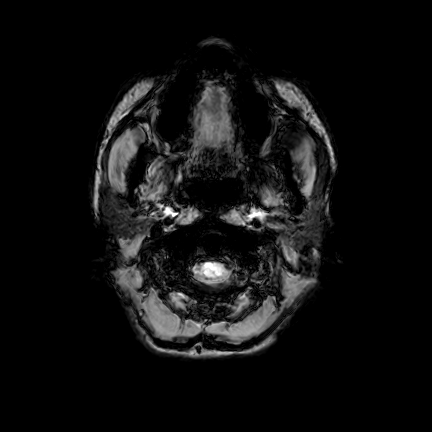
[im 16/79]
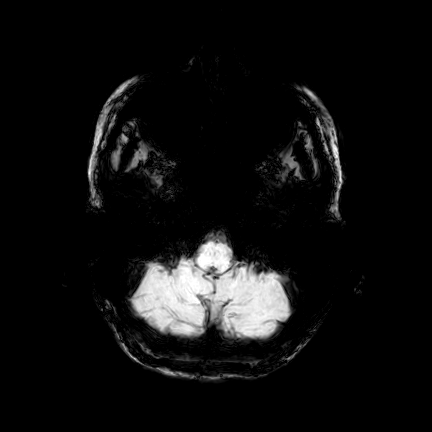
[im 32/79]
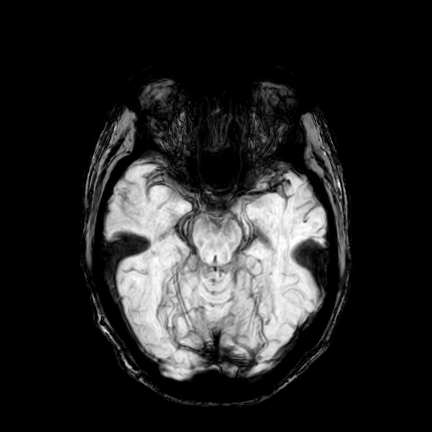
[im 47/79]
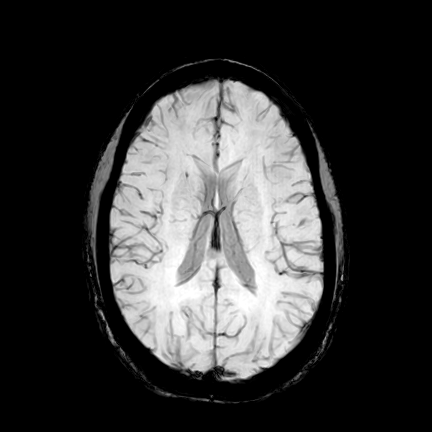

[Series 701: T2 · axial · 5.0mm · 0.41mm/px · z∈[-78,+73]mm · 2 of 27 slices shown]
[im 1/27]
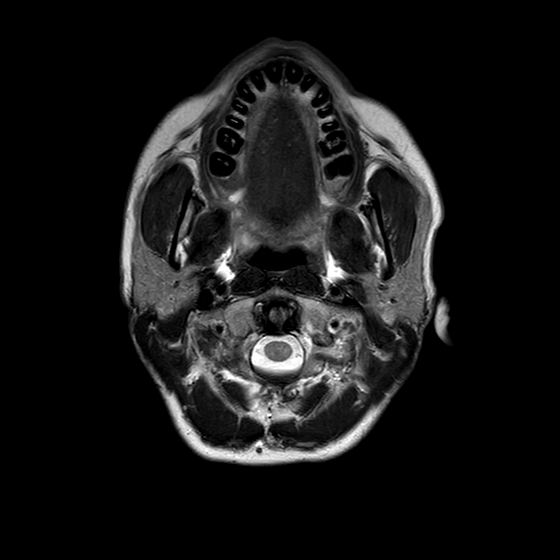
[im 27/27]
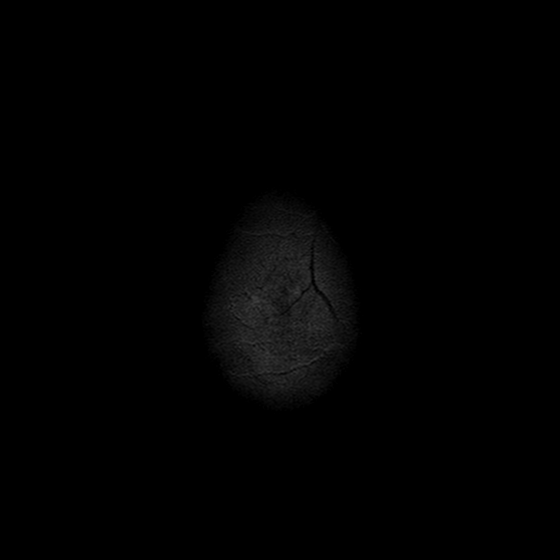

[Series 1101: T1 post-contrast · coronal · 4.0mm · 0.38mm/px · 3 of 36 slices shown (1 of 2)]
[im 1/36]
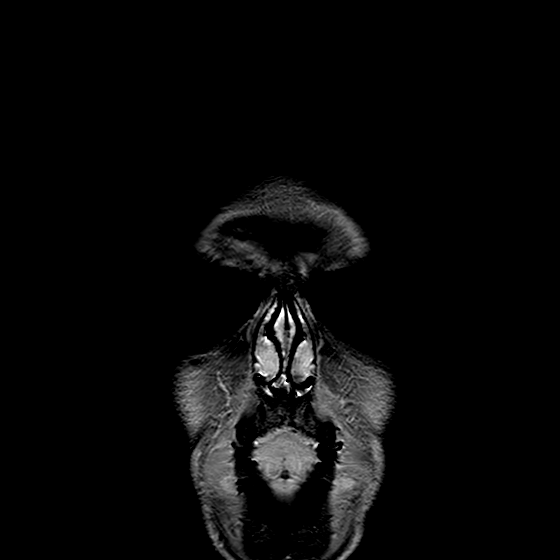
[im 18/36]
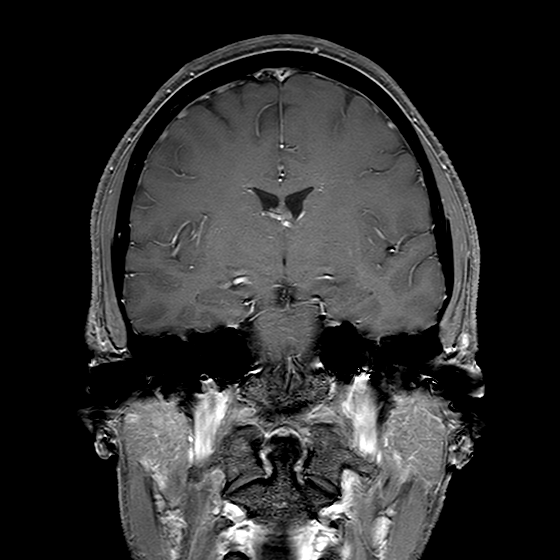
[im 36/36]
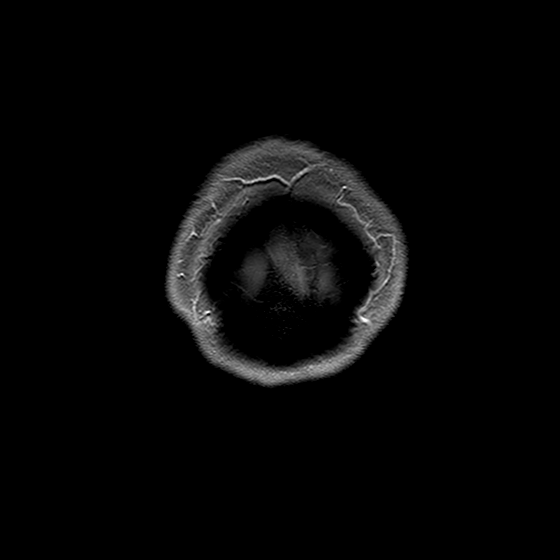

[Series 1201: T1 post-contrast · sagittal · 4.0mm · 0.34mm/px · 2 of 29 slices shown (2 of 2)]
[im 1/29]
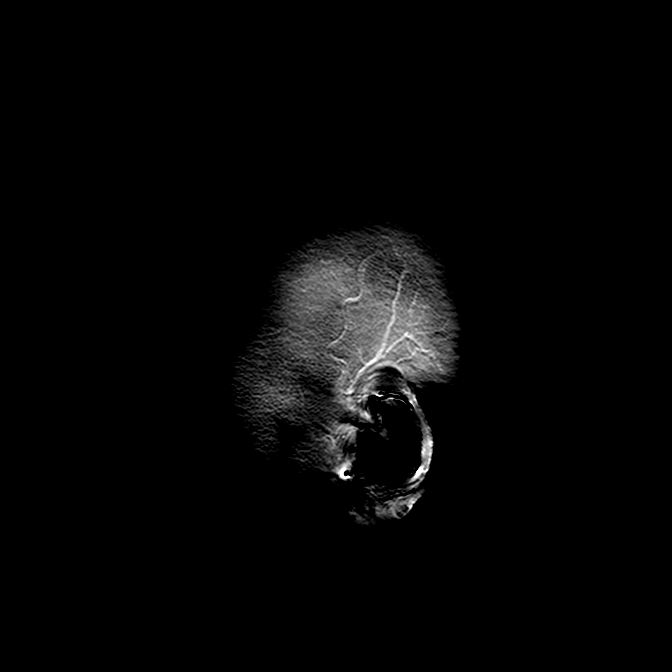
[im 29/29]
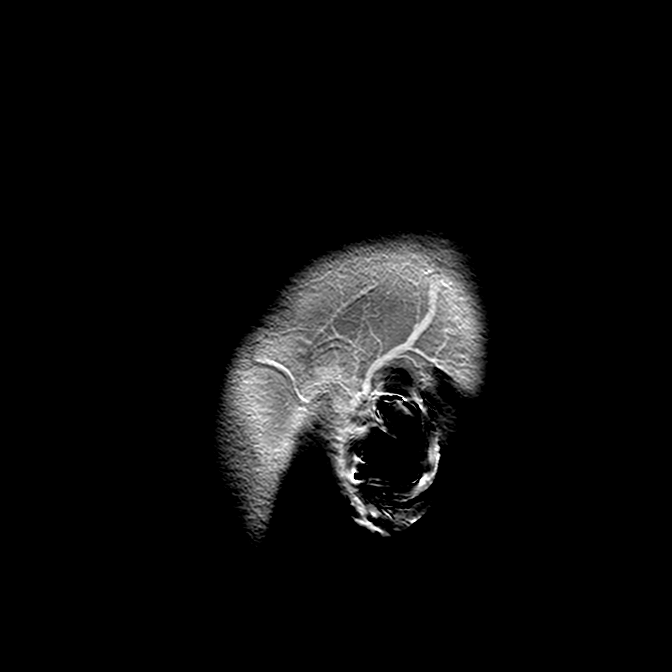

[21 of 48 positions shown; findings below may reference images not displayed]

FINDINGS: There is no restricted diffusion to suggest acute ischemia. There is no evidence 
for acute intracranial hemorrhage, acute infarct, or mass lesion. There are no 
intra or extra-axial fluid collections. There is no ventriculomegaly. 
Cerebral parenchymal volume appears within normal limits for the patient's age. 
The periventricular and subcortical white matter regions are normal in signal. 
The cerebellum is normal in signal and volume. Major intracranial flow voids are 
maintained. Midline structures including the brainstem and pituitary are normal 
in position and appearance. The craniocervical junction appears normal. Corpus 
callosum is normal in caliber and signal. Susceptibility weighted sequences 
demonstrate no evidence for abnormal hemosiderin deposition. 
Ocular structures appear normal. The paranasal sinuses are normally developed 
and well aerated. Scalp and calvarium appear normal. 
The mastoid air cells are well aerated without evidence of effusion. The 
external auditory canals appear normal. The cochlea and semicircular canals are 
normally developed and oriented bilaterally. Normal appearance of the 7 and VIII 
nerve complexes arising from the cerebellum. No enhancing lesion to suggest 
acoustic neuroma. Vestibular aqueducts are normal in caliber. There is no mass 
or fluid collection arising from the brainstem. The petrous apices are normally 
developed without fluid collection or evidence of pneumatization. 
Throughout the intracranial structures there is no abnormal enhancement. Patent 
intracranial vasculature as well as dural venous sinuses are appreciated.
IMPRESSION: Normal brain MRI. Normal MRI appearance of the internal auditory canals. 
No findings to explain the patient's vertigo, tinnitus, or hearing loss. No 
abnormal enhancement throughout the brain.

## 2023-04-06 IMAGING — MR MRI ABDOMEN W/WO CONTRAST WITH MRCP
17 of 24 series · 31 of 48 positions shown · IV contrast (gadolinium)
Comparison: CT exam of 05/21/2020.

________________________________________________________________________________________________ 
MRI ABDOMEN W/WO CONTRAST WITH MRCP, 04/06/2023 [DATE]: 
CLINICAL INDICATION: Acute pancreatitis without necrosis or infection. 
Epigastric pain, mostly on right side. Occasionally pain radiates around right 
to back. History of IBS. History of pancreatitis. History of hysterectomy and 
exploratory laparoscopy. History of cholecystectomy.
TECHNIQUE: Multiplanar, multi acquisition MR images of the abdomen were 
performed without and with intravenous gadolinium enhancement including dynamic 
imaging.  3D MRCP sequences were performed with post processing. 7 mL of 
Gadavist were injected intravenously by hand. 0.5 mL of Gadavist discarded. 
Patient was scanned on a 1.5T magnet.

[Series 101: survey-head 1st · axial · 15.0mm · 1.76mm/px · z∈[-50,+224]mm · 2 of 15 slices shown]
[im 1/15]
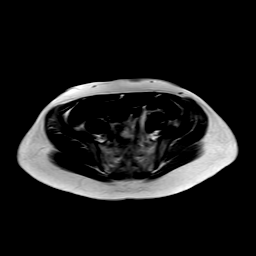
[im 15/15]
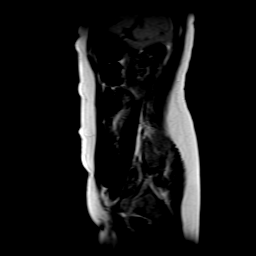

[Series 201: T2 · coronal · 5.0mm · 0.83mm/px · 2 of 32 slices shown]
[im 1/32]
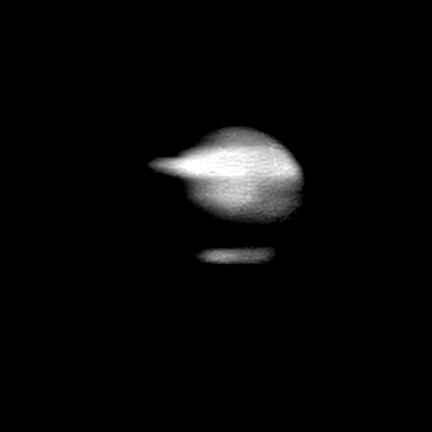
[im 32/32]
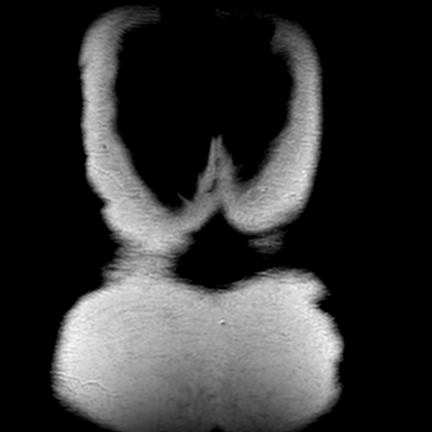

[Series 302: sout of phase · axial · 6.0mm · 1.12mm/px · z∈[+31,+276]mm · 2 of 36 slices shown]
[im 1/36]
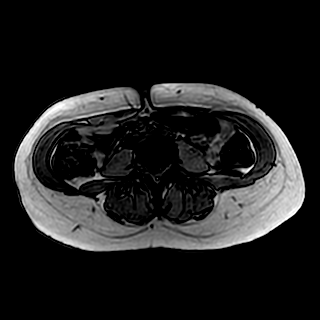
[im 36/36]
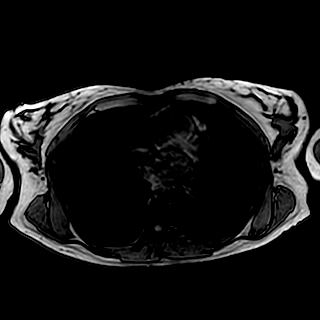

[Series 303: sin phase · axial · 6.0mm · 1.12mm/px · 1 of 36 slices shown]
[im 1/36]
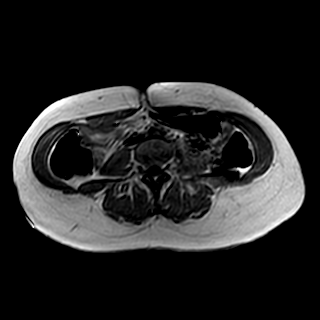

[Series 401: t2_ax_mvxd_hr_rt · axial · 5.0mm · 0.73mm/px · 1 of 42 slices shown]
[im 1/42]
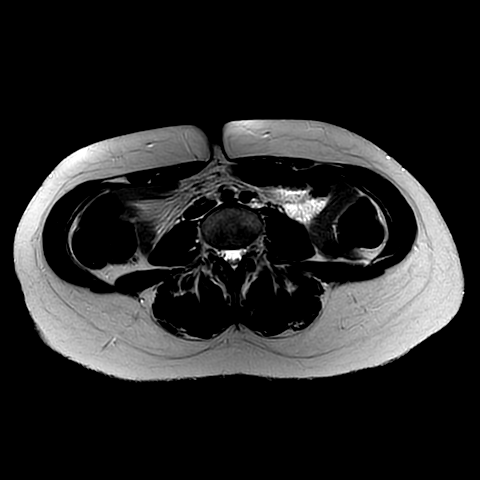

[Series 501: t2_spair mvxd_rt_fast · axial · 5.0mm · 0.81mm/px · 1 of 42 slices shown]
[im 1/42]
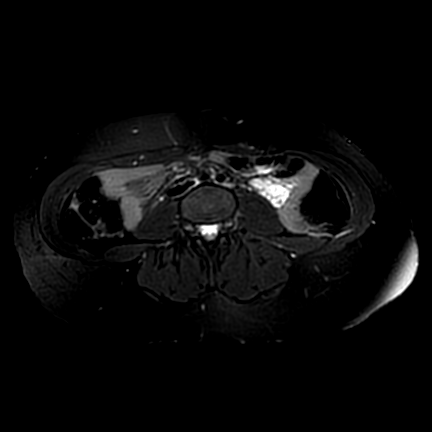

[Series 602: sbo · axial · 5.0mm · 1.61mm/px · 1 of 44 slices shown]
[im 1/44]
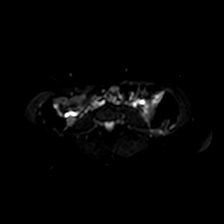

[Series 603: (id) · axial · 5.0mm · 1.61mm/px · 1 of 44 slices shown]
[im 1/44]
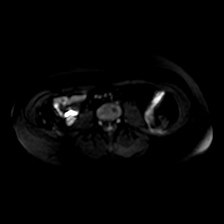

[Series 604: dadc 600 · axial · 5.0mm · 1.61mm/px · 1 of 44 slices shown]
[im 1/44]
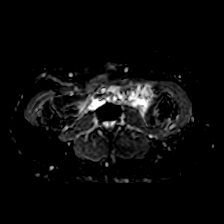

[Series 701: 3d_grase_bh · coronal · 3.0mm · 0.78mm/px · 2 of 58 slices shown]
[im 1/58]
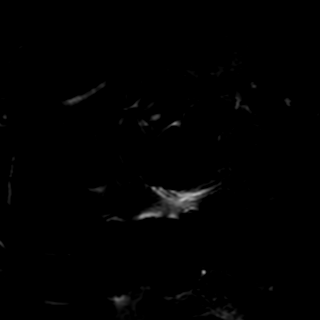
[im 58/58]
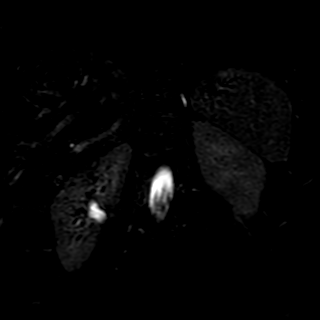

[Series 801: ssh_mrcp_radial_bh · coronal · 40.0mm · 0.59mm/px · 1 of 5 slices shown]
[im 1/5]
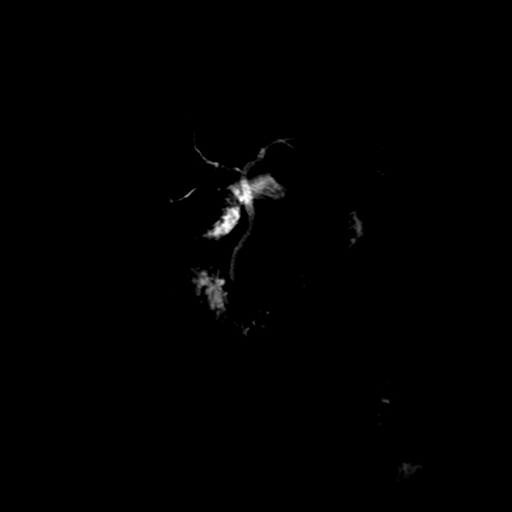

[Series 902: DIXON · axial · 4.0mm · 0.83mm/px · z∈[+34,+272]mm · 3 of 120 slices shown (1 of 5)]
[im 1/120]
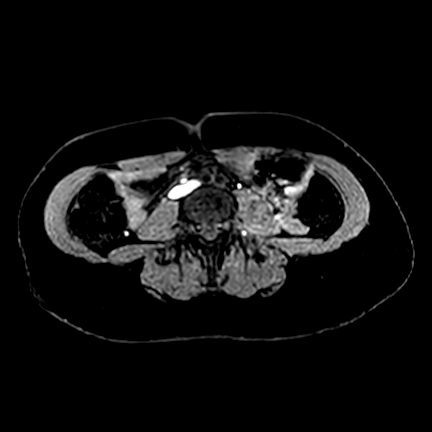
[im 60/120]
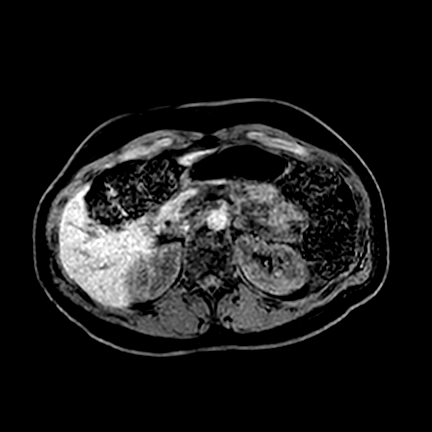
[im 120/120]
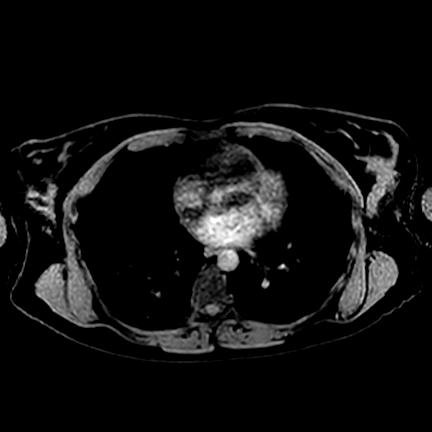

[Series 903: DIXON · axial · 4.0mm · 0.83mm/px · z∈[+34,+272]mm · 3 of 120 slices shown (2 of 5)]
[im 1/120]
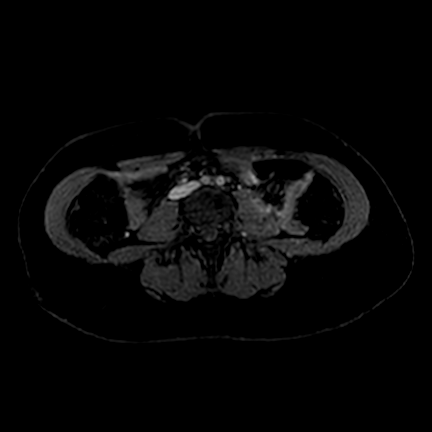
[im 60/120]
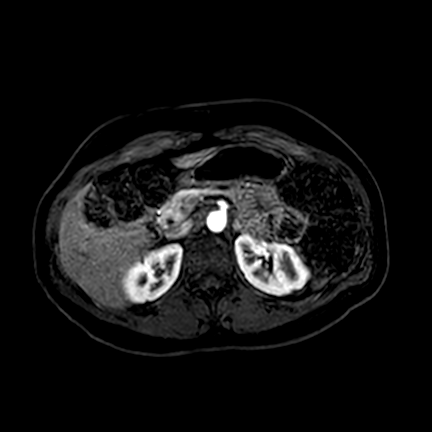
[im 120/120]
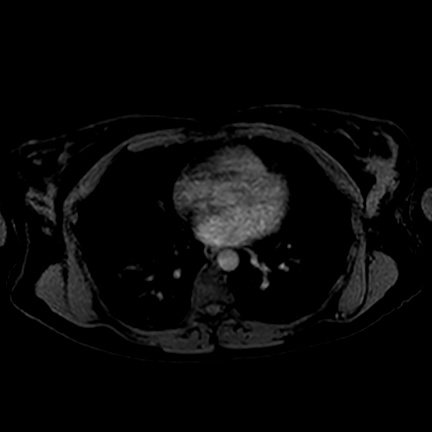

[Series 904: DIXON · axial · 4.0mm · 0.83mm/px · z∈[+34,+272]mm · 3 of 120 slices shown (3 of 5)]
[im 1/120]
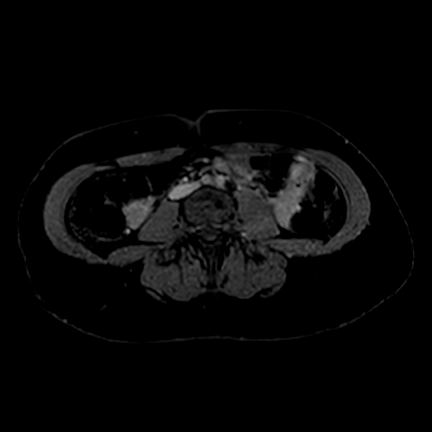
[im 60/120]
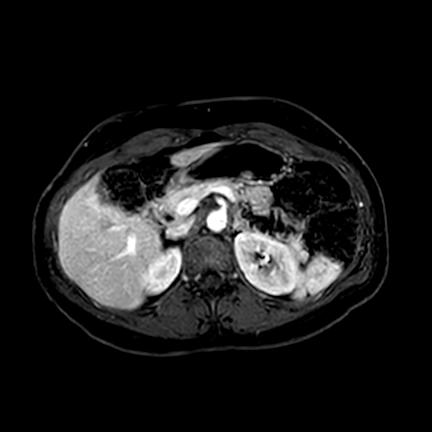
[im 120/120]
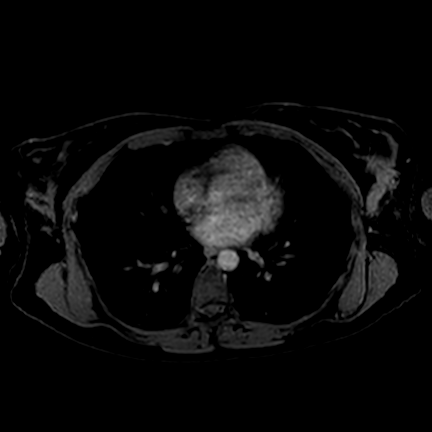

[Series 905: DIXON · axial · 4.0mm · 0.83mm/px · z∈[+34,+272]mm · 3 of 120 slices shown (4 of 5)]
[im 1/120]
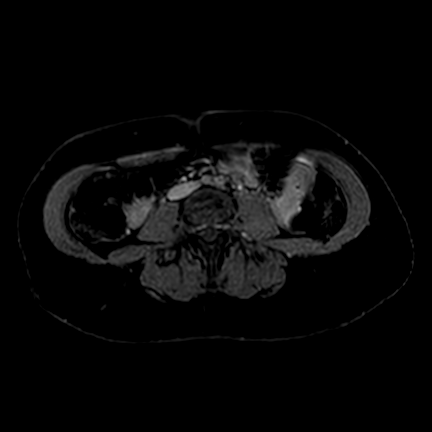
[im 60/120]
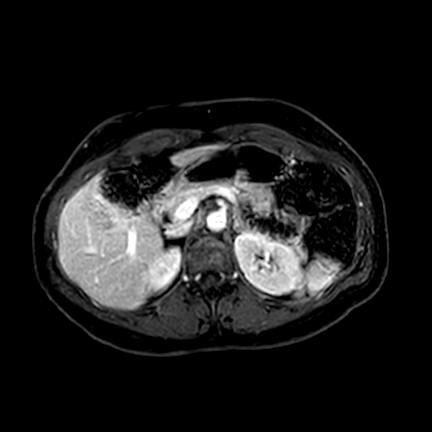
[im 120/120]
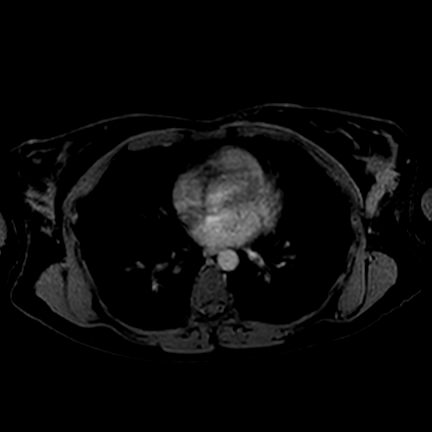

[Series 906: DIXON · axial · 4.0mm · 0.83mm/px · z∈[+34,+272]mm · 3 of 120 slices shown (5 of 5)]
[im 1/120]
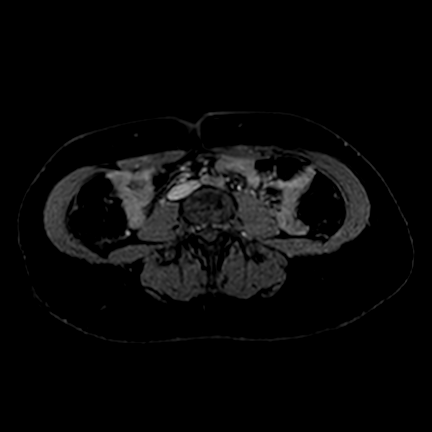
[im 60/120]
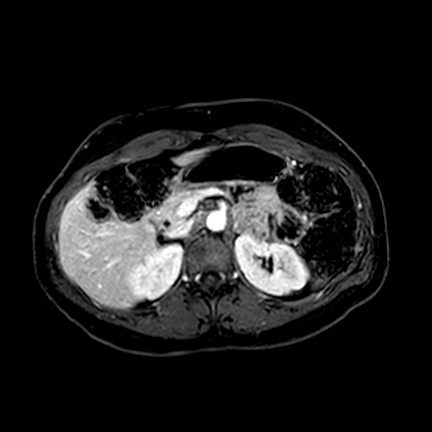
[im 120/120]
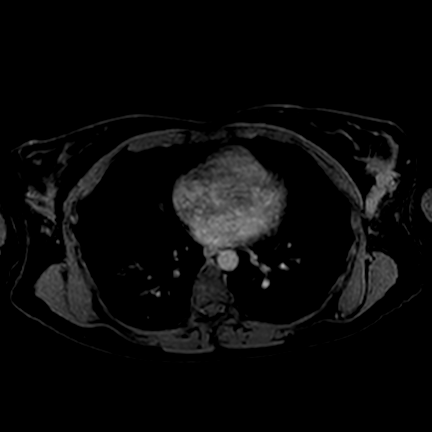

[Series 907: DIXON post-contrast · axial · 4.0mm · 0.83mm/px · 1 of 120 slices shown]
[im 1/120]
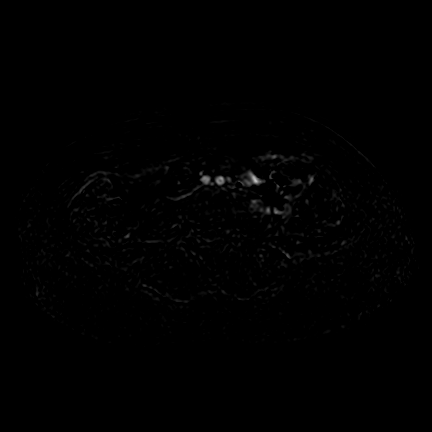

[31 of 48 positions shown; findings below may reference images not displayed]

FINDINGS: LOWER CHEST: Negative. 
HEPATOBILIARY: The liver is normal in size and signal intensity. No signal 
dropout on the out of phase series to suggest hepatic steatosis. No mass. No 
biliary dilatation. Gallbladder is surgically absent. No biliary dilatation. 
SPLEEN: Normal in size.  
PANCREAS: No ductal dilatation or mass.   
ADRENALS: No mass. 
GENITOURINARY: No enhancing mass or hydronephrosis.  Kidneys are symmetric. 
STOMACH, SMALL BOWEL AND COLON: Moderate prominence of stool throughout the 
included colon. 
LYMPH NODES: No adenopathy. 
VASCULAR STRUCTURES: No aneurysm.  
MUSCULOSKELETAL: No acute osseous abnormality.  
ADDITIONAL FINDINGS: None.
IMPRESSION: 1.  Constipation. 
2.  Cholecystectomy.
